# Patient Record
Sex: Male | Born: 1990
Health system: Southern US, Community
[De-identification: ages and names within clinical notes are randomized; demographics above are authoritative.]

## PROBLEM LIST (undated history)

## (undated) ENCOUNTER — Ambulatory Visit: Admission: EM | Payer: Managed Care, Other (non HMO) | Source: Home / Self Care

## (undated) HISTORY — PX: LAPAROSCOPIC CHOLECYSTECTOMY: SUR755

## (undated) HISTORY — PX: CHOLECYSTECTOMY: SHX55

---
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
## 2011-05-06 ENCOUNTER — Emergency Department (HOSPITAL_COMMUNITY): Payer: Self-pay

## 2011-05-06 ENCOUNTER — Emergency Department (HOSPITAL_COMMUNITY)
Admission: EM | Admit: 2011-05-06 | Discharge: 2011-05-06 | Disposition: A | Discharge status: Home / Self Care | Payer: Self-pay | Attending: Emergency Medicine | Admitting: Emergency Medicine

## 2011-05-06 ENCOUNTER — Encounter (HOSPITAL_COMMUNITY): Payer: Self-pay | Admitting: *Deleted

## 2011-05-06 DIAGNOSIS — F17200 Nicotine dependence, unspecified, uncomplicated: Secondary | ICD-10-CM | POA: Insufficient documentation

## 2011-05-06 DIAGNOSIS — R079 Chest pain, unspecified: Secondary | ICD-10-CM | POA: Insufficient documentation

## 2011-05-06 NOTE — ED Notes (Signed)
Approximately 2 weeks ago had gradual onset of intermittent midsternal chest pressure. Nausea without emesis. Also notes headache

## 2011-05-06 NOTE — ED Notes (Signed)
Discussed doing ECG with patient. Patient Refused. Does not have any insurance. Here because required by employer to have work note.

## 2011-05-06 NOTE — ED Provider Notes (Signed)
History     CSN: [id number]  Arrival date & time 05/06/11  4098   First MD Initiated Contact with Patient 05/06/11 [phone]      Chief Complaint  Patient presents with  . Chest Pain  . Nausea    (Consider location/radiation/quality/duration/timing/severity/associated sxs/prior treatment) HPI Pt presents with multiple complaints including headache, nausea, chest pain and shortness of breath.  He states that he called in sick to work 2 days in a row and presents to the ED to obtain a note from work.  He describes chest pain as intermittent over the past 2 weeks- located in his midsternum- pain has been coming and going- he states he was kicked in the chest during a mixed martial arts match and is unsure if this is related to his pain.  He also describes not being able to take a deep breath intermittently- but not related to pain.  No chest pain or sob on exertion.  No fever/chills.  Has had mild cough which he thinks is related to smoking cigarettes.  Pt is concerned that he does not want any tests to be done, but just wants a work  Note.   History reviewed. No pertinent past medical history.  Past Surgical History  Procedure Date  . Cholecystectomy     No family history on file.  History  Substance Use Topics  . Smoking status: Current Everyday Smoker -- 3.0 packs/day  . Smokeless tobacco: Not on file  . Alcohol Use: No      Review of Systems ROS reviewed and otherwise negative except for mentioned in HPI  Allergies  Review of patient's allergies indicates no known allergies.  Home Medications  No current outpatient prescriptions on file.  BP 127/68  Pulse 63  Temp(Src) 98.3 F (36.8 C) (Oral)  Resp 16  Ht 6\' 1"  (1.854 m)  Wt 200 lb (90.719 kg)  BMI 26.39 kg/m2  SpO2 100% Vitals reviewed Physical Exam Physical Examination: General appearance - alert, well appearing, and in no distress Mental status - alert, oriented to person, place, and time Eyes - pupils equal  and reactive, no scleral icterus Chest - clear to auscultation, no wheezes, rales or rhonchi, symmetric air entry, no increased respiratory effort or dyspnea Heart - normal rate, regular rhythm, normal S1, S2, no murmurs, rubs, clicks or gallops Abdomen - soft, nontender, nondistended, no masses or organomegaly, nabs Extremities - peripheral pulses normal, no pedal edema, no clubbing or cyanosis Skin - normal coloration and turgor, no rashes, brisk cap refill  ED Course  Procedures (including critical care time)  Labs Reviewed - No data to display No results found.   1. Chest pain       MDM  Pt presenting with multiple symtpoms, primarily chest pain intermittently over the past 2 weeks. EKG and CXR ordered.  This was discussed with the patient, he was concerned about the cost of these tests- I discussed with him the reason for ordering these screening tests.  He made the decision to leave without treatment complete.          Ethelda Chick, MD 05/06/11 [phone]

## 2014-10-11 ENCOUNTER — Emergency Department (HOSPITAL_COMMUNITY)
Admission: EM | Admit: 2014-10-11 | Discharge: 2014-10-11 | Disposition: A | Discharge status: Home / Self Care | Payer: Medicaid Other | Attending: Emergency Medicine | Admitting: Emergency Medicine

## 2014-10-11 ENCOUNTER — Encounter (HOSPITAL_COMMUNITY): Payer: Self-pay | Admitting: Nurse Practitioner

## 2014-10-11 DIAGNOSIS — Z720 Tobacco use: Secondary | ICD-10-CM | POA: Insufficient documentation

## 2014-10-11 DIAGNOSIS — M545 Low back pain, unspecified: Secondary | ICD-10-CM

## 2014-10-11 MED ORDER — TRAMADOL HCL 50 MG PO TABS
50.0000 mg | ORAL_TABLET | Freq: Once | ORAL | Status: AC
  Administered 2014-10-11: 50 mg via ORAL
  Filled 2014-10-11: qty 1

## 2014-10-11 MED ORDER — CYCLOBENZAPRINE HCL 10 MG PO TABS: 5.0000 mg | ORAL_TABLET | Freq: Two times a day (BID) | ORAL | Status: DC | PRN

## 2014-10-11 MED ORDER — PREDNISONE 10 MG PO TABS
20.0000 mg | ORAL_TABLET | Freq: Every day | ORAL | Status: DC
Start: 2014-10-11 — End: 2017-11-19

## 2014-10-11 MED ORDER — MELOXICAM 7.5 MG PO TABS
7.5000 mg | ORAL_TABLET | Freq: Every day | ORAL | Status: DC
Start: 2014-10-11 — End: 2017-11-19

## 2014-10-11 MED ORDER — CYCLOBENZAPRINE HCL 10 MG PO TABS
5.0000 mg | ORAL_TABLET | Freq: Once | ORAL | Status: AC
  Administered 2014-10-11: 5 mg via ORAL
  Filled 2014-10-11: qty 1

## 2014-10-11 NOTE — ED Provider Notes (Signed)
CSN: [id number]     Arrival date & time 10/11/14  1153 History   First MD Initiated Contact with Patient 10/11/14 1232     Chief Complaint  Patient presents with  . Back Pain     (Consider location/radiation/quality/duration/timing/severity/associated sxs/prior Treatment) HPI   Brent Waller 24 y.o.male  PCP: No primary care provider on file.  Blood pressure 137/85, pulse 84, temperature 97.8 F (36.6 C), temperature source Oral, resp. rate 16, height  (1.854 m), weight 291 lb 3.2 oz (132.087 kg), SpO2 97 %.  SIGNIFICANT PMH: cholecystectomy CHIEF COMPLAINT: low back pain  When: Started 1 week ago How: no known injury Duration: has been persisting for 1 week Location: low back Radiation: radiates up into the upper back Quality: spasm  Alleviating factors: least amount of pain when he wakes up in the morning Worsening factors: standing up straight Treatments tried: Ibuprofen for 3 days Associated Symptoms: None Negative ROS: Confusion, diaphoresis, fever, headache, weakness (general or focal), change of vision,  neck pain, dysphagia, aphagia, chest pain, shortness of breath, , abdominal pains, nausea, vomiting, diarrhea, lower extremity swelling, rash.   History reviewed. No pertinent past medical history. Past Surgical History  Procedure Laterality Date  . Cholecystectomy     History reviewed. No pertinent family history. Social History  Substance Use Topics  . Smoking status: Current Every Day Smoker -- 3.00 packs/day  . Smokeless tobacco: None  . Alcohol Use: No    Review of Systems 10 Systems reviewed and are negative for acute change except as noted in the HPI.    Allergies  Review of patient's allergies indicates no known allergies.  Home Medications   Prior to Admission medications   Medication Sig Start Date End Date Taking? Authorizing Provider  cyclobenzaprine (FLEXERIL) 10 MG tablet Take 0.5-1 tablets (5-10 mg total) by mouth 2 (two) times  daily as needed for muscle spasms. 10/11/14   Reita Shindler Neva Seat, PA-C  meloxicam (MOBIC) 7.5 MG tablet Take 1 tablet (7.5 mg total) by mouth daily. 10/11/14   Rasean Joos Neva Seat, PA-C  predniSONE (DELTASONE) 10 MG tablet Take 2 tablets (20 mg total) by mouth daily. 10/11/14   Samoria Fedorko Neva Seat, PA-C   BP 137/85 mmHg  Pulse 84  Temp(Src) 97.8 F (36.6 C) (Oral)  Resp 16  Ht  (1.854 m)  Wt 291 lb 3.2 oz (132.087 kg)  BMI 38.43 kg/m2  SpO2 97% Physical Exam  Constitutional: He appears well-developed and well-nourished. No distress.  HENT:  Head: Normocephalic and atraumatic.  Eyes: Pupils are equal, round, and reactive to light.  Neck: Normal range of motion. Neck supple.  Cardiovascular: Normal rate and regular rhythm.   Pulmonary/Chest: Effort normal.  Abdominal: Soft.  Musculoskeletal:  Symmetrical and physiologic strength to bilateral lower extremities.  Neurosensory function adequate to both legs Skin color is normal. Skin is warm and moist.  No step off deformity appreciated and no midline bony tenderness.  Can ambulate with some discomfort.  No crepitus, laceration, effusion, induration, lesions Pedal pulses are symmetrical and palpable bilaterally  No tenderness to palpation of back or hips. No clonus on dorsiflextion   Neurological: He is alert.  Skin: Skin is warm and dry.  Nursing note and vitals reviewed.   ED Course  Procedures (including critical care time) Labs Review Labs Reviewed - No data to display  Imaging Review No results found. I have personally reviewed and evaluated these images and lab results as part of my medical decision-making.   EKG  Interpretation None      MDM   Final diagnoses:  Bilateral low back pain without sciatica   Referral to Ortho. Advised not to drive on muscle relaxer medication.    Medication List    TAKE these medications        cyclobenzaprine 10 MG tablet  Commonly known as:  FLEXERIL  Take 0.5-1 tablets (5-10 mg  total) by mouth 2 (two) times daily as needed for muscle spasms.     meloxicam 7.5 MG tablet  Commonly known as:  MOBIC  Take 1 tablet (7.5 mg total) by mouth daily.     predniSONE 10 MG tablet  Commonly known as:  DELTASONE  Take 2 tablets (20 mg total) by mouth daily.        No neurological deficits and normal neuro exam. No loss of bowel or bladder control. No concern for cauda equina at this time base on HPI and physical exam findings. No fever, night sweats, weight loss, h/o cancer, IVDU. The patient can walk with some discomfort.   Patient Plan 1. Medications: NSAIDs and/or muscle relaxer. Cont usual home medications unless otherwise directed. 2. Treatment: rest, drink plenty of fluids, gentle stretching as discussed, alternate ice and heat  3. Follow Up: Please followup with your primary doctor for discussion of your diagnoses and further evaluation after today's visit; if you do not have a primary care doctor use the resource guide provided to find one  Advised to follow-up with the orthopedist if symptoms do not start to resolve in the next 2-3 days. If develop loss of bowel or urinary control return to the ED as soon as possible for further evaluation. To take the medications as prescribed as they can cause harm if not taken appropriately.   Vital signs are stable at discharge. Filed Vitals:   10/11/14 1207  BP: 137/85  Pulse: 84  Temp: 97.8 F (36.6 C)  Resp: 16    Patient/guardian has voiced understanding and agreed to follow-up with the PCP or specialist.         Marlon Pel, PA-C 10/11/14 1321  Gerhard Munch, MD 10/11/14 [phone]

## 2014-10-11 NOTE — ED Notes (Signed)
Declined W/C at D/C and was escorted to lobby by RN. 

## 2014-10-11 NOTE — Discharge Instructions (Signed)
Back Pain, Adult Low back pain is very common. About 1 in 5 people have back pain.The cause of low back pain is rarely dangerous. The pain often gets better over time.About half of people with a sudden onset of back pain feel better in just 2 weeks. About 8 in 10 people feel better by 6 weeks.  CAUSES Some common causes of back pain include:  Strain of the muscles or ligaments supporting the spine.  Wear and tear (degeneration) of the spinal discs.  Arthritis.  Direct injury to the back. DIAGNOSIS Most of the time, the direct cause of low back pain is not known.However, back pain can be treated effectively even when the exact cause of the pain is unknown.Answering your caregiver's questions about your overall health and symptoms is one of the most accurate ways to make sure the cause of your pain is not dangerous. If your caregiver needs more information, he or she may order lab work or imaging tests (X-rays or MRIs).However, even if imaging tests show changes in your back, this usually does not require surgery. HOME CARE INSTRUCTIONS For many people, back pain returns.Since low back pain is rarely dangerous, it is often a condition that people can learn to [facility] their own.   Remain active. It is stressful on the back to sit or stand in one place. Do not sit, drive, or stand in one place for more than 30 minutes at a time. Take short walks on level surfaces as soon as pain allows.Try to increase the length of time you walk each day.  Do not stay in bed.Resting more than 1 or 2 days can delay your recovery.  Do not avoid exercise or work.Your body is made to move.It is not dangerous to be active, even though your back may hurt.Your back will likely heal faster if you return to being active before your pain is gone.  Pay attention to your body when you bend and lift. Many people have less discomfortwhen lifting if they bend their knees, keep the load close to their bodies,and  avoid twisting. Often, the most comfortable positions are those that put less stress on your recovering back.  Find a comfortable position to sleep. Use a firm mattress and lie on your side with your knees slightly bent. If you lie on your back, put a pillow under your knees.  Only take over-the-counter or prescription medicines as directed by your caregiver. Over-the-counter medicines to reduce pain and inflammation are often the most helpful.Your caregiver may prescribe muscle relaxant drugs.These medicines help dull your pain so you can more quickly return to your normal activities and healthy exercise.  Put ice on the injured area.  Put ice in a plastic bag.  Place a towel between your skin and the bag.  Leave the ice on for 15-20 minutes, 03-04 times a day for the first 2 to 3 days. After that, ice and heat may be alternated to reduce pain and spasms.  Ask your caregiver about trying back exercises and gentle massage. This may be of some benefit.  Avoid feeling anxious or stressed.Stress increases muscle tension and can worsen back pain.It is important to recognize when you are anxious or stressed and learn ways to manage it.Exercise is a great option. SEEK MEDICAL CARE IF:  You have pain that is not relieved with rest or medicine.  You have pain that does not improve in 1 week.  You have new symptoms.  You are generally not feeling well. SEEK  IMMEDIATE MEDICAL CARE IF:  °· You have pain that radiates from your back into your legs. °· You develop new bowel or bladder control problems. °· You have unusual weakness or numbness in your arms or legs. °· You develop nausea or vomiting. °· You develop abdominal pain. °· You feel faint. °Document Released: 01/30/2005 Document Revised: 08/01/2011 Document Reviewed: 06/03/2013 °ExitCare® Patient Information ©2015 [employer]. This information is not intended to replace advice given to you by your health care provider. Make sure you  discuss any questions you have with your health care provider. ° °Back Injury Prevention °The following tips can help you to prevent a back injury. °PHYSICAL FITNESS °· Exercise often. Try to develop strong stomach (abdominal) muscles. °· Do aerobic exercises often. This includes walking, jogging, biking, swimming. °· Do exercises that help with balance and strength often. This includes tai chi and yoga. °· Stretch before and after you exercise. °· Keep a healthy weight. °DIET  °· Ask your doctor how much calcium and vitamin D you need every day. °· Include calcium in your diet. Foods high in calcium include dairy products; green, leafy vegetables; and products with calcium added (fortified). °· Include vitamin D in your diet. Foods high in vitamin D include milk and products with vitamin D added. °· Think about taking a multivitamin or other nutritional products called " supplements." °· Stop smoking if you smoke. °POSTURE  °· Sit and stand up straight. Avoid leaning forward or hunching over. °· Choose chairs that support your lower back. °· If you work at a desk: °¨ Sit close to your work so you do not lean over. °¨ Keep your chin tucked in. °¨ Keep your neck drawn back. °· Keep your elbows bent at a right angle. Your arms should look like the letter "L." °· Sit high and close to the steering wheel when you drive. Add low back support to your car seat if needed. °· Avoid sitting or standing in one position for too long. Get up and move around every hour. Take breaks if you are driving for a long time. °· Sleep on your side with your knees slightly bent. You can also sleep on your back with a pillow under your knees. Do not sleep on your stomach. °LIFTING, TWISTING, AND REACHING °· Avoid heavy lifting, especially lifting over and over again. If you must do heavy lifting: °¨ Stretch before lifting. °¨ Work slowly. °¨ Rest between lifts. °¨ Use carts and dollies to move objects when possible. °¨ Make several small  trips instead of carrying 1 heavy load. °¨ Ask for help when you need it. °¨ Ask for help when moving big, awkward objects. °· Follow these steps when lifting: °¨ Stand with your feet shoulder-width apart. °¨ Get as close to the object as you can. Do not pick up heavy objects that are far from your body. °¨ Use handles or lifting straps when possible. °¨ Bend at your knees. Squat down, but keep your heels off the floor. °¨ Keep your shoulders back, your chin tucked in, and your back straight. °¨ Lift the object slowly. Tighten the muscles in your legs, stomach, and butt. Keep the object as close to the center of your body as possible. °¨ Reverse these directions when you put a load down. °· Do not: °¨ Lift the object above your waist. °¨ Twist at the waist while lifting or carrying a load. Move your feet if you need to turn, not your waist. °¨   Bend over without bending at your knees. °· Avoid reaching over your head, across a table, or for an object on a high surface. °OTHER TIPS °· Avoid wet floors and keep sidewalks clear of ice. °· Do not sleep on a mattress that is too soft or too hard. °· Keep items that you use often within easy reach. °· Put heavier objects on shelves at waist level. Put lighter objects on lower or higher shelves. °· Find ways to lessen your stress. You can try exercise, massage, or relaxation. °· Get help for depression or anxiety if needed. °GET HELP IF: °· You injure your back. °· You have questions about diet, exercise, or other ways to prevent back injuries. °MAKE SURE YOU: °· Understand these instructions. °· Will watch your condition. °· Will get help right away if you are not doing well or get worse. °Document Released: 07/19/2007 Document Revised: 04/24/2011 Document Reviewed: 03/13/2011 °ExitCare® Patient Information ©2015 [employer]. This information is not intended to replace advice given to you by your health care provider. Make sure you discuss any questions you have with  your health care provider. ° °

## 2014-10-11 NOTE — ED Notes (Signed)
He c/o lower back pain radiating to upper back x 1 weeks. Pain increased with standing up straight, decreased when he wakes up in the am. Took ibuprofen for 3 days with no relief. denies bowel/bladder changes. Ambulatory, mae

## 2017-07-02 DIAGNOSIS — Z113 Encounter for screening for infections with a predominantly sexual mode of transmission: Secondary | ICD-10-CM | POA: Diagnosis not present

## 2017-07-06 ENCOUNTER — Ambulatory Visit (INDEPENDENT_AMBULATORY_CARE_PROVIDER_SITE_OTHER): Payer: BLUE CROSS/BLUE SHIELD | Admitting: Physician Assistant

## 2017-07-06 ENCOUNTER — Encounter: Payer: Self-pay | Admitting: Physician Assistant

## 2017-07-06 VITALS — BP 120/82 | HR 76 | Temp 99.0°F | Resp 16 | Ht 74.0 in | Wt 303.0 lb

## 2017-07-06 DIAGNOSIS — Z130 Encounter for screening for diseases of the blood and blood-forming organs and certain disorders involving the immune mechanism: Secondary | ICD-10-CM

## 2017-07-06 DIAGNOSIS — Z13220 Encounter for screening for lipoid disorders: Secondary | ICD-10-CM | POA: Diagnosis not present

## 2017-07-06 DIAGNOSIS — Z131 Encounter for screening for diabetes mellitus: Secondary | ICD-10-CM

## 2017-07-06 DIAGNOSIS — Z1329 Encounter for screening for other suspected endocrine disorder: Secondary | ICD-10-CM

## 2017-07-06 DIAGNOSIS — K921 Melena: Secondary | ICD-10-CM | POA: Diagnosis not present

## 2017-07-06 DIAGNOSIS — R1902 Left upper quadrant abdominal swelling, mass and lump: Secondary | ICD-10-CM

## 2017-07-06 DIAGNOSIS — Z113 Encounter for screening for infections with a predominantly sexual mode of transmission: Secondary | ICD-10-CM | POA: Diagnosis not present

## 2017-07-06 NOTE — Patient Instructions (Signed)
Can try flonase 2 sprays each nostril daily. Can take daily allergy medication.

## 2017-07-06 NOTE — Progress Notes (Signed)
Patient: Brent Waller Male    DOB: 12/16/1990   27 y.o.   MRN: [id number] Visit Date: 07/06/2017  Today's Provider: Trey Sailors, PA-C   Chief Complaint  Patient presents with  . Establish Care   Subjective:    HPI  Brent Waller is a 27 y/o man presenting today to establish care. He is living in [place], living with two roommates. 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 He smokes cigarettes occasionally when he is stressed. He smokes marijuana, does not use IV drugs. He is currently studying at [employer] and plans on transferring to [employer] to get his Bachelor's degree at [employer] in Human resources officer.   He reports he was prediabetic at age 27 - followed up after 6 months, and then decided not to care. He doesn't know who his pediatrciain was.   He reports being diagnosed with chlamydia at urgent care last week. Says he was treated with an IM shot and then four pills of "something that started with a D." Reports his partner has been treated. He wants to be tested for additional STI, including Herpes virus. He particularly wants to be tested for HSV because he says he has lesions in his genital and armpit regions that have been present for four years. He reports the areas swell up and can sometimes emit pus.   Also wants to know why his ears have pressure when he goes 1 foot under water.   He also has an abdominal bulge that he wonders about.    He reports blood in his stool that is bright red and intermittent. He reports alternating constipation and diarrhea that he attributes to his diet. He reports anal itching. He says he wants to be screened for colon cancer.   He reports he is currently at his highest weight. He says he has 1-2 days of good eating habits, followed by a week of binging on food.      No Known Allergies   Current Outpatient Medications:  .  cyclobenzaprine (FLEXERIL) 10 MG tablet, Take  0.5-1 tablets (5-10 mg total) by mouth 2 (two) times daily as needed for muscle spasms. (Patient not taking: Reported on 07/06/2017), Disp: 20 tablet, Rfl: 0 .  meloxicam (MOBIC) 7.5 MG tablet, Take 1 tablet (7.5 mg total) by mouth daily. (Patient not taking: Reported on 07/06/2017), Disp: 14 tablet, Rfl: 0 .  predniSONE (DELTASONE) 10 MG tablet, Take 2 tablets (20 mg total) by mouth daily. (Patient not taking: Reported on 07/06/2017), Disp: 15 tablet, Rfl: 0  Review of Systems  Constitutional: Positive for activity change, appetite change, diaphoresis and fatigue. Negative for chills, fever and unexpected weight change.  HENT: Positive for dental problem and ear pain. Negative for congestion, drooling, ear discharge, facial swelling, hearing loss, mouth sores, nosebleeds, postnasal drip, rhinorrhea, sinus pressure, sinus pain, sneezing, sore throat, tinnitus, trouble swallowing and voice change.   Eyes: Positive for visual disturbance. Negative for photophobia, pain, discharge, redness and itching.  Respiratory: Positive for chest tightness. Negative for apnea, cough, choking, shortness of breath, wheezing and stridor.   Cardiovascular: Positive for chest pain. Negative for palpitations and leg swelling.  Gastrointestinal: Positive for abdominal distention and abdominal pain. Negative for anal bleeding, blood in stool, constipation, diarrhea, nausea, rectal pain and vomiting.  Endocrine: Negative.   Genitourinary: Positive for genital sores. Negative for decreased urine volume, difficulty urinating, discharge, dysuria, enuresis, flank pain,  frequency, hematuria, penile pain, penile swelling, scrotal swelling, testicular pain and urgency.  Musculoskeletal: Negative.   Skin: Negative.   Allergic/Immunologic: Negative.   Neurological: Negative.   Hematological: Negative.   Psychiatric/Behavioral: Negative.     Social History   Tobacco Use  . Smoking status: Former Smoker    Packs/day: 0.50     Types: Cigarettes    Last attempt to quit: 02/14/2015    Years since quitting: 2.3  . Smokeless tobacco: Never Used  Substance Use Topics  . Alcohol use: No   Objective:   BP 120/82 (BP Location: Right Arm, Patient Position: Sitting, Cuff Size: Large)   Pulse 76   Temp 99 F (37.2 C) (Oral)   Resp 16   Ht  (1.88 m)   Wt (!) 303 lb (137.4 kg)   BMI 38.90 kg/m  Vitals:   07/06/17 1506  BP: 120/82  Pulse: 76  Resp: 16  Temp: 99 F (37.2 C)  TempSrc: Oral  Weight: (!) 303 lb (137.4 kg)  Height:  (1.88 m)     Physical Exam  Constitutional: He is oriented to person, place, and time. He appears well-developed and well-nourished.  HENT:  Head: Normocephalic.  Right Ear: External ear normal.  Left Ear: External ear normal.  Mouth/Throat: Oropharynx is clear and moist.  Cardiovascular: Normal rate and regular rhythm.  Pulmonary/Chest: Effort normal and breath sounds normal.  Abdominal: Soft. Bowel sounds are normal.    Neurological: He is alert and oriented to person, place, and time.  Skin: Skin is warm and dry.        Assessment & Plan:     1. Screening examination for STD (sexually transmitted disease)  I have counseled him, particularly about HSV serology. It does not tell where the virus is on the body. I am getting this purely at his request, and have counseled that to confirm the location he would need a swab. Any further questions regarding the results of this test would require an office visit. He expresses understanding and is agreeable with the plan.   - HIV antibody (with reflex) - RPR - Hepatitis c antibody (reflex) - HSV(herpes simplex vrs) 1+2 ab-IgG  2. Left upper quadrant abdominal mass  Potential hernia. Counseled on precautions.  3. Blood in stool  Counseled on difference between screening and diagnostic. Provided oc light to take self sample and should return to office.   - POCT Occult Blood Stool  4. Screening cholesterol  level  - Lipid Profile  5. Diabetes mellitus screening  - Comprehensive Metabolic Panel (CMET)  6. Thyroid disorder screening  - TSH  7. Screening for deficiency anemia  - CBC with Differential   Return in about 1 year (around 07/07/2018) for CPE.  The entirety of the information documented in the History of Present Illness, Review of Systems and Physical Exam were personally obtained by me. Portions of this information were initially documented by Kavin Leech, CMA and reviewed by me for thoroughness and accuracy.        Trey Sailors, PA-C  [facility]

## 2017-07-16 ENCOUNTER — Other Ambulatory Visit: Payer: Self-pay | Admitting: Physician Assistant

## 2017-07-16 DIAGNOSIS — Z1329 Encounter for screening for other suspected endocrine disorder: Secondary | ICD-10-CM | POA: Diagnosis not present

## 2017-07-16 DIAGNOSIS — Z113 Encounter for screening for infections with a predominantly sexual mode of transmission: Secondary | ICD-10-CM

## 2017-07-16 DIAGNOSIS — Z13220 Encounter for screening for lipoid disorders: Secondary | ICD-10-CM | POA: Diagnosis not present

## 2017-07-16 DIAGNOSIS — Z131 Encounter for screening for diabetes mellitus: Secondary | ICD-10-CM | POA: Diagnosis not present

## 2017-07-17 LAB — CBC WITH DIFFERENTIAL/PLATELET
Basophils Absolute: 0.1 10*3/uL (ref 0.0–0.2)
Basos: 1 %
EOS (ABSOLUTE): 0.1 10*3/uL (ref 0.0–0.4)
Eos: 1 %
Hematocrit: 37.5 % (ref 37.5–51.0)
Hemoglobin: 12.5 g/dL — ABNORMAL LOW (ref 13.0–17.7)
Immature Grans (Abs): 0 10*3/uL (ref 0.0–0.1)
Immature Granulocytes: 0 %
Lymphocytes Absolute: 2.2 10*3/uL (ref 0.7–3.1)
Lymphs: 19 %
MCH: 24.6 pg — ABNORMAL LOW (ref 26.6–33.0)
MCHC: 33.3 g/dL (ref 31.5–35.7)
MCV: 74 fL — ABNORMAL LOW (ref 79–97)
Monocytes Absolute: 0.9 10*3/uL (ref 0.1–0.9)
Monocytes: 8 %
Neutrophils Absolute: 8.2 10*3/uL — ABNORMAL HIGH (ref 1.4–7.0)
Neutrophils: 71 %
Platelets: 421 10*3/uL (ref 150–450)
RBC: 5.09 x10E6/uL (ref 4.14–5.80)
RDW: 16.4 % — ABNORMAL HIGH (ref 12.3–15.4)
WBC: 11.6 10*3/uL — ABNORMAL HIGH (ref 3.4–10.8)

## 2017-07-17 LAB — COMPREHENSIVE METABOLIC PANEL
ALT: 26 IU/L (ref 0–44)
AST: 31 IU/L (ref 0–40)
Albumin/Globulin Ratio: 1.4 (ref 1.2–2.2)
Albumin: 4.4 g/dL (ref 3.5–5.5)
Alkaline Phosphatase: 73 IU/L (ref 39–117)
BUN/Creatinine Ratio: 11 (ref 9–20)
BUN: 12 mg/dL (ref 6–20)
Bilirubin Total: 0.7 mg/dL (ref 0.0–1.2)
CO2: 20 mmol/L (ref 20–29)
Calcium: 9.2 mg/dL (ref 8.7–10.2)
Chloride: 103 mmol/L (ref 96–106)
Creatinine, Ser: 1.09 mg/dL (ref 0.76–1.27)
GFR calc Af Amer: 108 mL/min/{1.73_m2} (ref >59)
GFR calc non Af Amer: 93 mL/min/{1.73_m2} (ref >59)
Globulin, Total: 3.2 g/dL (ref 1.5–4.5)
Glucose: 86 mg/dL (ref 65–99)
Potassium: 4 mmol/L (ref 3.5–5.2)
Sodium: 138 mmol/L (ref 134–144)
Total Protein: 7.6 g/dL (ref 6.0–8.5)

## 2017-07-17 LAB — LIPID PANEL
Chol/HDL Ratio: 4.3 ratio (ref 0.0–5.0)
Cholesterol, Total: 155 mg/dL (ref 100–199)
HDL: 36 mg/dL — ABNORMAL LOW (ref >39)
LDL Calculated: 100 mg/dL — ABNORMAL HIGH (ref 0–99)
Triglycerides: 96 mg/dL (ref 0–149)
VLDL Cholesterol Cal: 19 mg/dL (ref 5–40)

## 2017-07-17 LAB — HIV ANTIBODY (ROUTINE TESTING W REFLEX): HIV Screen 4th Generation wRfx: NONREACTIVE

## 2017-07-17 LAB — HCV COMMENT:

## 2017-07-17 LAB — HSV(HERPES SIMPLEX VRS) I + II AB-IGG
HSV 1 Glycoprotein G Ab, IgG: 57.9 index — ABNORMAL HIGH (ref 0.00–0.90)
HSV 2 IgG, Type Spec: 1.64 index — ABNORMAL HIGH (ref 0.00–0.90)

## 2017-07-17 LAB — HSV-2 IGG SUPPLEMENTAL TEST: HSV-2 IgG Supplemental Test: POSITIVE — AB

## 2017-07-17 LAB — HEPATITIS C ANTIBODY (REFLEX): HCV Ab: <0.1 s/co ratio (ref 0.0–0.9)

## 2017-07-17 LAB — RPR: RPR Ser Ql: NONREACTIVE

## 2017-07-17 LAB — TSH: TSH: 2.46 u[IU]/mL (ref 0.450–4.500)

## 2017-07-18 ENCOUNTER — Telehealth: Payer: Self-pay

## 2017-07-18 LAB — CHLAMYDIA/GONOCOCCUS/TRICHOMONAS, NAA
Chlamydia by NAA: NEGATIVE
Gonococcus by NAA: NEGATIVE
Trich vag by NAA: NEGATIVE

## 2017-07-18 NOTE — Telephone Encounter (Signed)
Pt advised. Appointment made for 07/24/2017 at 4pm  Thanks,   -Vernona RiegerLaura

## 2017-07-18 NOTE — Telephone Encounter (Signed)
-----   Message from Trey SailorsAdriana M Pollak, [place]PA-C sent at 07/18/2017  8:19 AM EDT ----- Gonorrhea, chlamydia, and trichomonas all negative.

## 2017-07-18 NOTE — Telephone Encounter (Signed)
-----   Message from Trey SailorsAdriana M Pollak, [place] sent at 07/17/2017 10:51 AM EDT ----- HIV non reactive. Syphilis negative. Hep C negative. 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 As we discussed in office, this does not indicate where the infection is on the body. If he would like to discuss this further, please have him make office visit. Cholesterol well controlled. CMET normal, shows no diabetes. CBC shows a small white count, may be a small infection. Slightly low hemoglobin. Can repeat in one month if patient interested. Urine gonorrhea, chlamydia and trich is still pending.

## 2017-07-24 ENCOUNTER — Ambulatory Visit: Payer: BLUE CROSS/BLUE SHIELD | Admitting: Physician Assistant

## 2017-07-25 ENCOUNTER — Ambulatory Visit: Payer: BLUE CROSS/BLUE SHIELD | Admitting: Physician Assistant

## 2017-09-04 DIAGNOSIS — K219 Gastro-esophageal reflux disease without esophagitis: Secondary | ICD-10-CM | POA: Diagnosis not present

## 2017-09-04 DIAGNOSIS — R0789 Other chest pain: Secondary | ICD-10-CM | POA: Diagnosis not present

## 2017-09-05 ENCOUNTER — Encounter: Payer: Self-pay | Admitting: Physician Assistant

## 2017-09-05 ENCOUNTER — Ambulatory Visit
Admission: RE | Admit: 2017-09-05 | Discharge: 2017-09-05 | Disposition: A | Discharge status: Home / Self Care | Payer: BLUE CROSS/BLUE SHIELD | Source: Ambulatory Visit | Attending: Physician Assistant | Admitting: Physician Assistant

## 2017-09-05 ENCOUNTER — Ambulatory Visit (INDEPENDENT_AMBULATORY_CARE_PROVIDER_SITE_OTHER): Payer: BLUE CROSS/BLUE SHIELD | Admitting: Physician Assistant

## 2017-09-05 VITALS — BP 112/62 | HR 100 | Temp 99.9°F | Resp 16 | Wt 284.0 lb

## 2017-09-05 DIAGNOSIS — R059 Cough, unspecified: Secondary | ICD-10-CM

## 2017-09-05 DIAGNOSIS — R062 Wheezing: Secondary | ICD-10-CM | POA: Diagnosis not present

## 2017-09-05 DIAGNOSIS — R509 Fever, unspecified: Secondary | ICD-10-CM | POA: Insufficient documentation

## 2017-09-05 DIAGNOSIS — R05 Cough: Secondary | ICD-10-CM | POA: Insufficient documentation

## 2017-09-05 DIAGNOSIS — R079 Chest pain, unspecified: Secondary | ICD-10-CM | POA: Diagnosis not present

## 2017-09-05 MED ORDER — ALBUTEROL SULFATE HFA 108 (90 BASE) MCG/ACT IN AERS: 2.0000 | INHALATION_SPRAY | Freq: Four times a day (QID) | RESPIRATORY_TRACT | 2 refills | Status: DC | PRN

## 2017-09-05 NOTE — Patient Instructions (Signed)

## 2017-09-05 NOTE — Progress Notes (Signed)
Patient: Brent Waller Elsey Male    DOB: 02-Aug-1990   27 y.o.   MRN: [id number] Visit Date: 09/06/2017  Today's Provider: Trey SailorsAdriana M Pollak, PA-C   Chief Complaint  Patient presents with  . Cough   Subjective:    Brent Waller Kulish is a 27 y/o presenting today for flu like symptoms ongoing since Sunday. He reports a fever, sore throat, cough, congestion and muscle aches. He felt very tired and was unable to get out of bed. He has been taking some ibuprofen. He does feel moderately improved but has started having chest pain which brought him to urgent care yesterday. He has chest pain to the right of his sternum and his left ribcage. Can be random, hurts worse when he coughs. Does not get worse with activity and better with rest. He has had some SOB with activity recently, like going up the stairs to clinic. He has also had a decreased appetite.  URI   This is a new problem. The current episode started in the past 7 days. The problem has been gradually improving. Associated symptoms include abdominal pain, congestion, coughing, nausea, a rash and wheezing. Pertinent negatives include no diarrhea, ear pain, headaches, neck pain, rhinorrhea, sinus pain, sore throat or vomiting. He has tried NSAIDs for the symptoms.       No Known Allergies   Current Outpatient Medications:  .  albuterol (PROVENTIL HFA;VENTOLIN HFA) 108 (90 Base) MCG/ACT inhaler, Inhale 2 puffs into the lungs every 6 (six) hours as needed for wheezing or shortness of breath., Disp: 1 Inhaler, Rfl: 2 .  cyclobenzaprine (FLEXERIL) 10 MG tablet, Take 0.5-1 tablets (5-10 mg total) by mouth 2 (two) times daily as needed for muscle spasms. (Patient not taking: Reported on 09/05/2017), Disp: 20 tablet, Rfl: 0 .  meloxicam (MOBIC) 7.5 MG tablet, Take 1 tablet (7.5 mg total) by mouth daily. (Patient not taking: Reported on 09/05/2017), Disp: 14 tablet, Rfl: 0 .  predniSONE (DELTASONE) 10 MG tablet, Take 2 tablets (20 mg total) by mouth  daily. (Patient not taking: Reported on 07/06/2017), Disp: 15 tablet, Rfl: 0  Review of Systems  Constitutional: Positive for appetite change, chills, diaphoresis, fatigue and fever. Negative for activity change and unexpected weight change.  HENT: Positive for congestion. Negative for ear discharge, ear pain, postnasal drip, rhinorrhea, sinus pressure, sinus pain, sore throat and tinnitus.   Eyes: Negative.   Respiratory: Positive for cough, chest tightness, shortness of breath and wheezing. Negative for apnea, choking and stridor.   Gastrointestinal: Positive for abdominal pain, blood in stool (Occasionally) and nausea. Negative for abdominal distention, anal bleeding, constipation, diarrhea, rectal pain and vomiting.  Musculoskeletal: Positive for myalgias. Negative for arthralgias, back pain, gait problem, joint swelling, neck pain and neck stiffness.  Skin: Positive for rash. Negative for color change, pallor and wound.  Neurological: Positive for dizziness. Negative for light-headedness and headaches.    Social History   Tobacco Use  . Smoking status: Former Smoker    Packs/day: 0.50    Types: Cigarettes    Last attempt to quit: 02/14/2015    Years since quitting: 2.5  . Smokeless tobacco: Never Used  Substance Use Topics  . Alcohol use: No   Objective:   BP 112/62 (BP Location: Right Arm, Patient Position: Sitting, Cuff Size: Large)   Pulse 100   Temp 99.9 F (37.7 C) (Oral)   Resp 16   Wt 284 lb (128.8 kg)   SpO2 95%  BMI 36.46 kg/m  Vitals:   09/05/17 1630  BP: 112/62  Pulse: 100  Resp: 16  Temp: 99.9 F (37.7 C)  TempSrc: Oral  SpO2: 95%  Weight: 284 lb (128.8 kg)     Physical Exam  Constitutional: He is oriented to person, place, and time. He appears well-developed and well-nourished. He appears ill.  Cardiovascular: Normal rate and regular rhythm.  Pulmonary/Chest: Effort normal and breath sounds normal. No respiratory distress. He has no wheezes. He has  no rales.  Neurological: He is alert and oriented to person, place, and time.  Skin: Skin is warm and dry.  Psychiatric: He has a normal mood and affect. His behavior is normal.        Assessment & Plan:     1. Cough  Patient does appear ill in clinic, slight temperature with borderline tachycardia. He does report feeing a lot better but was concerned about his chest pain. Think this is not cardiac and most likely inflammatory from his URI and strain from coughing. Will get CXR for reassurance, no adverse sounds on exam. Does not have any rash to indicate tick borne illness and he has clear respiratory symptoms. Advised to take Tylenol to lower temperature, drink plenty of fluids, and use Delsym over the counter for cough.   - DG Chest 2 View; Future  2. Fever, unspecified fever cause  - DG Chest 2 View; Future  3. Wheezing  Can use as below for SOB.  - albuterol (PROVENTIL HFA;VENTOLIN HFA) 108 (90 Base) MCG/ACT inhaler; Inhale 2 puffs into the lungs every 6 (six) hours as needed for wheezing or shortness of breath.  Dispense: 1 Inhaler; Refill: 2  Return if symptoms worsen or fail to improve.  The entirety of the information documented in the History of Present Illness, Review of Systems and Physical Exam were personally obtained by me. Portions of this information were initially documented by Kavin Leech, CMA and reviewed by me for thoroughness and accuracy.         Trey Sailors, PA-C  [facility]

## 2017-09-06 ENCOUNTER — Telehealth: Payer: Self-pay

## 2017-09-06 NOTE — Telephone Encounter (Signed)
-----   Message from Trey SailorsAdriana M Pollak, [place]C sent at 09/06/2017  8:16 AM EDT ----- CXR normal, no signs of pneumonia.

## 2017-09-06 NOTE — Telephone Encounter (Signed)
Pt advised.   Thanks,   -Claira Jeter  

## 2017-11-18 NOTE — Progress Notes (Signed)
Cardiology Office Note  Date:  11/19/2017   ID:  Brent Waller, DOB 11/30/1990, MRN [id number]  PCP:  Trey Sailors, PA-C   Chief Complaint  Patient presents with  . OTHER    Chest pain, bilateral arm pain, bad night sweats and sob. Meds reviewed verbally with pt.    HPI:  Mr. Brent Waller is a 27 year old male with history of Smoker of MAJ Morbid obesity Self referral for chest pain  In July 2019 he developed viral symptoms Symptoms of fever, sore throat, cough, congestion and muscle aches.  tired and was unable to get out of bed.  Developed some chest pain and was seen in urgent care chest pain to the right of his sternum and his left ribcage. hurts worse when he coughs. Does not get worse with activity and better with rest.  The symptoms have persisted several months later When working out, gets chest pain,  Pain is bilateral worse on the left on the right lower ribs He smokes marijuana frequently, typically will cough Sometimes with arm pain  Normal CXR, need with him  Lab work reviewed Total cholesterol 155 Does not smoke regular cigarettes Most recent glucose level less than 100  He is trying to eat better, intentionally trying to lose weight  EKG personally reviewed by myself on todays visit Shows normal sinus rhythm rate 79 bpm no significant ST or T wave changes   PMH: Morbid obesity Smokes marijuana  PSH:    Past Surgical History:  Procedure Laterality Date  . CHOLECYSTECTOMY      Current Outpatient Medications  Medication Sig Dispense Refill  . albuterol (PROVENTIL HFA;VENTOLIN HFA) 108 (90 Base) MCG/ACT inhaler Inhale 2 puffs into the lungs every 6 (six) hours as needed for wheezing or shortness of breath. 1 Inhaler 2   No current facility-administered medications for this visit.      Allergies:   Patient has no known allergies.   Social History:  The patient  reports that he quit smoking about 2 years ago. His smoking use included  cigarettes. He smoked 0.50 packs per day. He has never used smokeless tobacco. He reports that he drinks alcohol. He reports that he has current or past drug history. Drug: Marijuana.   Family History:   family history includes Cancer in his maternal grandfather; Heart Problems in his father; Lung cancer in his maternal grandmother; Pancreatic cancer in his father.    Review of Systems: Review of Systems  Constitutional: Negative.   Respiratory: Negative.   Cardiovascular: Positive for chest pain.  Gastrointestinal: Negative.   Musculoskeletal: Positive for back pain.  Neurological: Negative.   Psychiatric/Behavioral: Negative.   All other systems reviewed and are negative.  PHYSICAL EXAM: VS:  BP 123/74 (BP Location: Right Arm, Patient Position: Sitting, Cuff Size: Large)   Pulse 79   Ht 6\' 1"  (1.854 m)   Wt (!) 304 lb 8 oz (138.1 kg)   BMI 40.17 kg/m  , BMI Body mass index is 40.17 kg/m. GEN: Well nourished, well developed, in no acute distress , obese HEENT: normal  Neck: no JVD, carotid bruits, or masses Cardiac: RRR; no murmurs, rubs, or gallops,no edema  Respiratory:  clear to auscultation bilaterally, normal work of breathing GI: soft, nontender, nondistended, + BS MS: no deformity or atrophy  Discomfort when pushing on his left mid thoracic spine Skin: warm and dry, no rash Neuro:  Strength and sensation are intact Psych: euthymic mood, full affect   Recent Labs: 07/16/2017:  ALT 26; BUN 12; Creatinine, Ser 1.09; Hemoglobin 12.5; Platelets 421; Potassium 4.0; Sodium 138; TSH 2.460    Lipid Panel Lab Results  Component Value Date   CHOL 155 07/16/2017   HDL 36 (L) 07/16/2017   LDLCALC 100 (H) 07/16/2017   TRIG 96 07/16/2017      Wt Readings from Last 3 Encounters:  11/19/17 (!) 304 lb 8 oz (138.1 kg)  09/05/17 284 lb (128.8 kg)  07/06/17 (!) 303 lb (137.4 kg)       ASSESSMENT AND PLAN:  Morbid obesity (HCC) We have encouraged continued exercise,  careful diet management in an effort to lose weight.  Chest pain, unspecified type - Plan: EKG 12-Lead After long discussion, thorough exam, he has tenderness mid thoracic spine worse on the left than the right, radiating pain around that rib distribution to the anterior Likely has chest and back pain exacerbated by coughing that started July 2019, continue to have coughing when he smokes marijuana  Rib pain Recommended thoracic stretching/spine exercises Minimize marijuana to decrease coughing If no improvement would consider chiropractic  Encounter for preventive care Cholesterol low, not smoke cigarettes, no diabetes Recommended weight loss We did discuss other studies such as CT coronary calcium scoring, stress testing No strong indication for these at this time   Disposition:   F/U as needed   Total encounter time more than 45 minutes  Greater than 50% was spent in counseling and coordination of care with the patient    Orders Placed This Encounter  Procedures  . EKG 12-Lead     Signed, Dossie Arbour, M.D., Ph.D. 11/19/2017  [facility] [place], [place] [phone]

## 2017-11-19 ENCOUNTER — Encounter: Payer: Self-pay | Admitting: Cardiovascular Disease

## 2017-11-19 ENCOUNTER — Ambulatory Visit: Payer: BLUE CROSS/BLUE SHIELD | Admitting: Cardiovascular Disease

## 2017-11-19 DIAGNOSIS — E669 Obesity, unspecified: Secondary | ICD-10-CM | POA: Insufficient documentation

## 2017-11-19 DIAGNOSIS — Z0000 Encounter for general adult medical examination without abnormal findings: Secondary | ICD-10-CM | POA: Diagnosis not present

## 2017-11-19 DIAGNOSIS — R079 Chest pain, unspecified: Secondary | ICD-10-CM | POA: Diagnosis not present

## 2017-11-19 DIAGNOSIS — R0781 Pleurodynia: Secondary | ICD-10-CM | POA: Diagnosis not present

## 2017-11-19 NOTE — Patient Instructions (Signed)
Ice, stretch,  Read about thoracic spine stretches Chiro if needed   Medication Instructions:   No medication changes made  Labwork:  No new labs needed  Testing/Procedures:  No further testing at this time   Follow-Up: It was a pleasure seeing you in the office today. Please call us if you have new issues that need to be addressed before your next appt.  [phone]  Your physician wants you to follow-up in:  As needed  If you need a refill on your cardiac medications before your next appointment, please call your pharmacy.  For educational health videos Log in to : [email or web address] Or : [email or web address], password : [email or web address]

## 2017-12-04 ENCOUNTER — Encounter: Payer: Self-pay | Admitting: Physician Assistant

## 2017-12-04 ENCOUNTER — Ambulatory Visit: Payer: BLUE CROSS/BLUE SHIELD | Admitting: Physician Assistant

## 2017-12-04 VITALS — BP 110/90 | HR 73 | Temp 99.3°F | Resp 16 | Ht 73.0 in | Wt 299.0 lb

## 2017-12-04 DIAGNOSIS — Z23 Encounter for immunization: Secondary | ICD-10-CM | POA: Diagnosis not present

## 2017-12-04 DIAGNOSIS — R61 Generalized hyperhidrosis: Secondary | ICD-10-CM

## 2017-12-04 DIAGNOSIS — R1900 Intra-abdominal and pelvic swelling, mass and lump, unspecified site: Secondary | ICD-10-CM | POA: Diagnosis not present

## 2017-12-04 DIAGNOSIS — K921 Melena: Secondary | ICD-10-CM | POA: Diagnosis not present

## 2017-12-04 NOTE — Progress Notes (Signed)
Patient: Brent Waller Male    DOB: 1990/08/29   27 y.o.   MRN: [id number] Visit Date: 12/06/2017  Today's Provider: Trinna Post, PA-C   Chief Complaint  Patient presents with  . Abdominal Pain   Subjective:    HPI Patient here today with multiple complaints.  Abdominal Pain/Abdominal Mass: Reports migrating abdominal pain that happens any time. It could happen in his stomach region or in his LUQ. He has it every day and it could happen for ten minutes or less. There is no relation to eating. He reports nausea, constipation, and diarrhea, but denies vomiting. He report he is taking ibuprofen for this. He also reports a bulge in his LUQ that he is very concerned about. He is concerned this could be a hernia, especially a hiatal hernia. He is also concerned it could be worse.  Bloody stools: He reports bright red blood in his stool. He reports alternating diarrhea and constipation. He reports sometimes painful bowel movements and also anal itching. He first reported these symptoms to me on 07/06/2017. He was given a take home stool card which he did not complete or return to the office because he lost it. He repeatedly denies rectal examination today, saying he is too embarrassed. He denies weight loss and fevers. Denies any mucous in stool.   Wt Readings from Last 3 Encounters:  12/04/17 299 lb (135.6 kg)  11/19/17 (!) 304 lb 8 oz (138.1 kg)  09/05/17 284 lb (128.8 kg)   Night Sweats:   Reports he has night sweats 5/7 nights per week. He says he sleeps with one sheet, sometimes a comforter and his room is between 42 and 72 F. He reports he is a sweaty person at baseline, though this is usually brought on by physical exertion. He has not measured his temperature during these episodes. He has not traveled out of the country or anywhere with malaria or TB, has never been incarcerated. No weight loss, no history of cancer.      No Known Allergies   Current Outpatient  Medications:  .  albuterol (PROVENTIL HFA;VENTOLIN HFA) 108 (90 Base) MCG/ACT inhaler, Inhale 2 puffs into the lungs every 6 (six) hours as needed for wheezing or shortness of breath., Disp: 1 Inhaler, Rfl: 2  Review of Systems  Constitutional: Negative.   Respiratory: Positive for cough.   Gastrointestinal: Positive for abdominal pain, blood in stool and constipation.    Social History   Tobacco Use  . Smoking status: Former Smoker    Packs/day: 0.50    Types: Cigarettes    Last attempt to quit: 02/14/2015    Years since quitting: 2.8  . Smokeless tobacco: Never Used  Substance Use Topics  . Alcohol use: Yes    Comment: occassional   Objective:   BP 110/90 (BP Location: Left Arm, Patient Position: Sitting, Cuff Size: Large)   Pulse 73   Temp 99.3 F (37.4 C) (Oral)   Resp 16   Ht _0  (1.854 m)   Wt 299 lb (135.6 kg)   SpO2 97%   BMI 39.45 kg/m  Vitals:   12/04/17 1611  BP: 110/90  Pulse: 73  Resp: 16  Temp: 99.3 F (37.4 C)  TempSrc: Oral  SpO2: 97%  Weight: 299 lb (135.6 kg)  Height: _1  (1.854 m)     Physical Exam  Constitutional: He is oriented to person, place, and time. He appears well-developed and well-nourished.  Abdominal: Soft.  Normal appearance and bowel sounds are normal. There is no hepatosplenomegaly. There is tenderness in the left upper quadrant.  Abdomen is obese. I do not see distinct mass in right upper quadrant that patient is reporting. LUQ is tender to palpation, patient describes "like a bruise" during my examination.   Genitourinary:  Genitourinary Comments: Repeatedly declines rectal exam.   Neurological: He is alert and oriented to person, place, and time.  Skin: Skin is warm and dry.  Psychiatric: He has a normal mood and affect. His behavior is normal.        Assessment & Plan:     1. Night sweats  Has not measured temperature, unsure if this represents fever. Will check labs as below.  - CBC With Differential -  Ambulatory referral to General Surgery  2. Bloody stool  Patient repeatedly declines rectal exam, saying he is too embarrassed. I stressed the importance of conducting this exam to assess for hemorrhoids, rectal fissures or tears but he continues to decline. He also did not complete prior stool card. Have sent one home with him today to return to clinic. His symptoms could represent initial presentation of Crohns or UC. Will check labs as below, get stool card, and proceed from there. May need GI referral in future but I think we can do initial testing.   - C-reactive protein - Sed Rate (ESR)  3. Abdominal mass, unspecified abdominal location   I do not clearly see abdominal mass in the LUQ as patient describes. Assured that it would be highly unlikely that a hiatal hernia would present in this way, but patient is not reassured. He is worried this is something else causing this. Very anxious about getting to the bottom of this. He thinks he needs to go to GI, which he may at some point to assess rectal bleeding, but he also thinks they will address this "abdominal mass," he does seem most anxious about this. Will refer to surgery.  Return if symptoms worsen or fail to improve.  The entirety of the information documented in the History of Present Illness, Review of Systems and Physical Exam were personally obtained by me. Portions of this information were initially documented by Lynford Humphrey, CMA and reviewed by me for thoroughness and accuracy.   I have spent 25 minutes with this patient, >50% of which was spent on counseling and coordination of care.        Trinna Post, PA-C  [facility]

## 2017-12-06 NOTE — Patient Instructions (Signed)
Rectal Bleeding °Rectal bleeding is when blood comes out of the opening of the butt (anus). People with this kind of bleeding may notice bright red blood in their underwear or in the toilet after they poop (have a bowel movement). They may also have dark red or black poop (stool). Rectal bleeding is often a sign that something is wrong. It needs to be checked by a doctor. °Follow these instructions at home: °Watch for any changes in your condition. Take these actions to help with bleeding and discomfort: °· Eat a diet that is high in fiber. This will keep your poop soft so it is easier for you to poop without pushing too hard. Ask your doctor to tell you what foods and drinks are high in fiber. °· Drink enough fluid to keep your pee (urine) clear or pale yellow. This also helps keep your poop soft. °· Try taking a warm bath. This may help with pain. °· Keep all follow-up visits as told by your doctor. This is important. ° °Get help right away if: °· You have new bleeding. °· You have more bleeding than before. °· You have black or dark red poop. °· You throw up (vomit) blood or something that looks like coffee grounds. °· You have pain or tenderness in your belly (abdomen). °· You have a fever. °· You feel weak. °· You feel sick to your stomach (nauseous). °· You pass out (faint). °· You have very bad pain in your butt. °· You cannot poop. °This information is not intended to replace advice given to you by your health care provider. Make sure you discuss any questions you have with your health care provider. °Document Released: 10/12/2010 Document Revised: 07/08/2015 Document Reviewed: 03/28/2015 °Elsevier Interactive Patient Education © 2018 [employer] ° °

## 2017-12-06 NOTE — Addendum Note (Signed)
Addended by: Trey Sailors on: 12/06/2017 03:29 PM   Modules accepted: Orders

## 2017-12-25 ENCOUNTER — Encounter: Payer: Self-pay | Admitting: Surgery

## 2017-12-25 ENCOUNTER — Other Ambulatory Visit: Payer: Self-pay

## 2017-12-25 ENCOUNTER — Ambulatory Visit (INDEPENDENT_AMBULATORY_CARE_PROVIDER_SITE_OTHER): Payer: BLUE CROSS/BLUE SHIELD | Admitting: Surgery

## 2017-12-25 VITALS — BP 147/98 | HR 70 | Temp 97.7°F | Resp 18 | Ht 73.0 in | Wt 302.0 lb

## 2017-12-25 DIAGNOSIS — R0781 Pleurodynia: Secondary | ICD-10-CM | POA: Diagnosis not present

## 2017-12-25 NOTE — Patient Instructions (Signed)
Patient is to return to the office as needed. He has been informed to call the office with any questions or concerns.  

## 2017-12-27 NOTE — Progress Notes (Signed)
Surgical Clinic History and Physical  Referring provider:  Trey Sailors, PA-C [address] [place], [place] [address]  HISTORY OF PRESENT ILLNESS (HPI):  27 y.o. male presents for evaluation of a LUQ abdominal wall "bulge" or mass. Patient reports he's uncertain how long it's been present, but he recently noticed a prominent focus of assymmetry between his LUQ and his RUQ, expressing concern that this could be a hiatal hernia. He denies any associated pain, redness, focal trauma, constipation, straining with urination, frequent coughing, N/V, fever/chills, CP, or SOB.  PAST MEDICAL HISTORY (PMH):  History reviewed. No pertinent past medical history.   PAST SURGICAL HISTORY (PSH):  Past Surgical History:  Procedure Laterality Date  . CHOLECYSTECTOMY    . LAPAROSCOPIC CHOLECYSTECTOMY       MEDICATIONS:  Prior to Admission medications   Not on File     ALLERGIES:  No Known Allergies   SOCIAL HISTORY:  Social History   Socioeconomic History  . Marital status: Single    Spouse name: Not on file  . Number of children: Not on file  . Years of education: Not on file  . Highest education level: Not on file  Occupational History  . Not on file  Social Needs  . Financial resource strain: Not on file  . Food insecurity:    Worry: Not on file    Inability: Not on file  . Transportation needs:    Medical: Not on file    Non-medical: Not on file  Tobacco Use  . Smoking status: Former Smoker    Packs/day: 0.50    Types: Cigarettes    Last attempt to quit: 02/14/2015    Years since quitting: 2.8  . Smokeless tobacco: Never Used  Substance and Sexual Activity  . Alcohol use: Yes    Comment: occassional  . Drug use: Yes    Types: Marijuana  . Sexual activity: Yes  Lifestyle  . Physical activity:    Days per week: Not on file    Minutes per session: Not on file  . Stress: Not on file  Relationships  . Social connections:    Talks on phone: Not on file     Gets together: Not on file    Attends religious service: Not on file    Active member of club or organization: Not on file    Attends meetings of clubs or organizations: Not on file    Relationship status: Not on file  . Intimate partner violence:    Fear of current or ex partner: Not on file    Emotionally abused: Not on file    Physically abused: Not on file    Forced sexual activity: Not on file  Other Topics Concern  . Not on file  Social History Narrative  . Not on file    The patient currently resides (home / rehab facility / nursing home): Home The patient normally is (ambulatory / bedbound): Ambulatory  FAMILY HISTORY:  Family History  Problem Relation Age of Onset  . Pancreatic cancer Father   . Heart Problems Father   . Lung cancer Maternal Grandmother   . Cancer Maternal Grandfather     Otherwise negative/non-contributory.  REVIEW OF SYSTEMS:  Constitutional: denies any other weight loss, fever, chills, or sweats  Eyes: denies any other vision changes, history of eye injury  ENT: denies sore throat, hearing problems  Respiratory: denies shortness of breath, wheezing  Cardiovascular: denies chest pain, palpitations  Gastrointestinal: denies abdominal pain,  N/V, constipation, or diarrhea Musculoskeletal: denies any other joint pains or cramps  Skin: Denies any other rashes or skin discolorations except as per HPI Neurological: denies any other headache, dizziness, weakness  Psychiatric: Denies any other depression, anxiety   All other review of systems were otherwise negative   VITAL SIGNS:  BP (!) 147/98   Pulse 70   Temp 97.7 F (36.5 C) (Temporal)   Resp 18   Ht 6\' 1"  (1.854 m)   Wt (!) 302 lb (137 kg)   SpO2 98%   BMI 39.84 kg/m   PHYSICAL EXAM:  Constitutional:  -- Obese body habitus  -- Awake, alert, and oriented x3  Eyes:  -- Pupils equally round and reactive to light  -- No scleral icterus  Ear, nose, throat:  -- No jugular venous  distension -- No nasal drainage, bleeding Pulmonary:  -- No crackles  -- Equal breath sounds bilaterally -- Breathing non-labored at rest Cardiovascular:  -- S1, S2 present  -- No pericardial rubs  Gastrointestinal:  -- Abdomen soft, nontender, non-distended, no guarding/rebound  -- Site of expressed concern appears to be associated with slightly more prominent Left > Right costal margin along patient's ribs -- No abdominal masses appreciated, pulsatile or otherwise Musculoskeletal and Integumentary:  -- Wounds or skin discoloration: None appreciated -- Extremities: B/L UE and LE FROM, hands and feet warm, no edema  Neurologic:  -- Motor function: Intact and symmetric -- Sensation: Intact and symmetric  Labs:  CBC Latest Ref Rng & Units 07/16/2017 04/15/2010 11/01/2009  WBC 3.4 - 10.8 x10E3/uL 11.6(H) - 9.7  Hemoglobin 13.0 - 17.7 g/dL 12.5(L) 16.3 13.4  Hematocrit 37.5 - 51.0 % 37.5 48.0 39.8  Platelets 150 - 450 x10E3/uL 421 - 208   CMP Latest Ref Rng & Units 07/16/2017 04/15/2010 11/01/2009  Glucose 65 - 99 mg/dL 86 161(W100(H) 960(A114(H)  BUN 6 - 20 mg/dL 12 16 7   Creatinine 0.76 - 1.27 mg/dL 5.401.09 0.8 9.810.94  Sodium 191134 - 144 mmol/L 138 139 141  Potassium 3.5 - 5.2 mmol/L 4.0 4.3 3.6  Chloride 96 - 106 mmol/L 103 101 106  CO2 20 - 29 mmol/L 20 - 31  Calcium 8.7 - 10.2 mg/dL 9.2 - 8.7  Total Protein 6.0 - 8.5 g/dL 7.6 - 6.1  Total Bilirubin 0.0 - 1.2 mg/dL 0.7 - 2.8(H)  Alkaline Phos 39 - 117 IU/L 73 - 47  AST 0 - 40 IU/L 31 - 31  ALT 0 - 44 IU/L 26 - 31   Imaging studies:  Chest X-ray (09/05/2017) - personally reviewed The heart size and mediastinal contours are within normal limits. Both lungs are clear. The visualized skeletal structures are unremarkable. Remote cholecystectomy noted. Trachea is midline. No acute osseous finding.  Assessment/Plan:  27 y.o. male with essentially asymptomatic (beyond visible) slightly more prominent Left > Right costal margin, complicated by  co-morbidities including morbid obesity (BMI 40).   - apparent etiology for asymmetry discussed  - no indication for surgical intervention at this time  - signs/symptoms of hiatal and ventral hernias were discussed  - return to clinic as needed, instructed to call if any questions or concerns  - weight loss encouraged and discussed  All of the above recommendations were discussed with the patient, and all of patient's questions were answered to his expressed satisfaction.  Thank you for the opportunity to participate in this patient's care.  -- Scherrie GerlachJason E. Earlene Plateravis, MD, RPVI [place]:  [facility]  for exceptional care. Office: [phone]

## 2018-01-03 ENCOUNTER — Encounter: Payer: Self-pay | Admitting: Surgery

## 2018-06-18 ENCOUNTER — Encounter: Payer: Self-pay | Admitting: Physician Assistant

## 2018-06-18 ENCOUNTER — Ambulatory Visit (INDEPENDENT_AMBULATORY_CARE_PROVIDER_SITE_OTHER): Payer: BLUE CROSS/BLUE SHIELD | Admitting: Physician Assistant

## 2018-06-18 DIAGNOSIS — T148XXA Other injury of unspecified body region, initial encounter: Secondary | ICD-10-CM | POA: Diagnosis not present

## 2018-06-18 MED ORDER — CYCLOBENZAPRINE HCL 10 MG PO TABS: 10.0000 mg | ORAL_TABLET | Freq: Every day | ORAL | 0 refills | Status: DC

## 2018-06-18 MED ORDER — PREDNISONE 10 MG (21) PO TBPK: ORAL_TABLET | ORAL | 0 refills | Status: DC

## 2018-06-18 NOTE — Progress Notes (Signed)
       Patient: Brent Waller Male    DOB: 01-15-1991   28 y.o.   MRN: [id number] Visit Date: 06/18/2018  Today's Provider: Trey Sailors, PA-C   Chief Complaint  Patient presents with  . Muscle Pain   Subjective:    Virtual Visit via Video Note  I connected with Brent Waller on 06/18/18 at 11:00 AM EDT by a video enabled telemedicine application and verified that I am speaking with the correct person using two identifiers.   I discussed the limitations of evaluation and management by telemedicine and the availability of in person appointments. The patient expressed understanding and agreed to proceed.   Patient location: home Provider location: [facility]/home office  Persons involved in the visit: patient, provider   Interactive audio and video communications were attempted, although failed due to patient's inability to connect to video. Continued visit with audio only interaction with patient agreement.  HPI   Patient presents today with left sharp pain in chest and back that is associated with movement. He reports he is a chronic smoker and has been coughing more. He reports he also has neck pain that is associated with movement. Reports pain starts in the back in the morning and spreads to chest in the day. Reports he smokes 2-3 cigarettes daily. Reports moving heavy furniture. Reports he can point to a single area where it hurts with a finger. Denies fever, chills, SOB, nausea, vomiting, diaphoresis. Reports he has tried ibuprofen 600-800 mg twice daily and ointment.   No Known Allergies  No current outpatient medications on file.  Review of Systems  Constitutional: Negative for appetite change, chills and fever.  Respiratory: Negative for chest tightness, shortness of breath and wheezing.   Cardiovascular: Negative for chest pain and palpitations.  Gastrointestinal: Negative for abdominal pain, nausea and vomiting.    Social History   Tobacco  Use  . Smoking status: Former Smoker    Packs/day: 0.50    Types: Cigarettes    Last attempt to quit: 02/14/2015    Years since quitting: 3.3  . Smokeless tobacco: Never Used  Substance Use Topics  . Alcohol use: Yes    Comment: occassional      Objective:   There were no vitals taken for this visit. There were no vitals filed for this visit.   Physical Exam      Assessment & Plan    1. Muscle strain  - predniSONE (STERAPRED UNI-PAK 21 TAB) 10 MG (21) TBPK tablet; Take 6 pills on day 1, take 5 pills on day 2, take 4 pills on day 3, and so on until complete.  Dispense: 21 tablet; Refill: 0 - cyclobenzaprine (FLEXERIL) 10 MG tablet; Take 1 tablet (10 mg total) by mouth at bedtime.  Dispense: 30 tablet; Refill: 0  The entirety of the information documented in the History of Present Illness, Review of Systems and Physical Exam were personally obtained by me. Portions of this information were initially documented by April M. Hyacinth Meeker, CMA and reviewed by me for thoroughness and accuracy.   F/u prn.     Trey Sailors, PA-C  [facility]

## 2018-06-18 NOTE — Patient Instructions (Signed)
Muscle Strain A muscle strain is an injury that happens when a muscle is stretched longer than normal. This can happen during a fall, sports, or lifting. This can tear some muscle fibers. Usually, recovery from muscle strain takes 1-2 weeks. Complete healing normally takes 5-6 weeks. This condition is first treated with PRICE therapy. This involves:  Protecting your muscle from being injured again.  Resting your injured muscle.  Icing your injured muscle.  Applying pressure (compression) to your injured muscle. This may be done with a splint or elastic bandage.  Raising (elevating) your injured muscle. Your doctor may also recommend medicine for pain. Follow these instructions at home: If you have a splint:  Wear the splint as told by your doctor. Take it off only as told by your doctor.  Loosen the splint if your fingers or toes tingle, get numb, or turn cold and blue.  Keep the splint clean.  If the splint is not waterproof: ? Do not let it get wet. ? Cover it with a watertight covering when you take a bath or a shower. Managing pain, stiffness, and swelling   If directed, put ice on your injured area. ? If you have a removable splint, take it off as told by your doctor. ? Put ice in a plastic bag. ? Place a towel between your skin and the bag. ? Leave the ice on for 20 minutes, 2-3 times a day.  Move your fingers or toes often. This helps to avoid stiffness and lessen swelling.  Raise your injured area above the level of your heart while you are sitting or lying down.  Wear an elastic bandage as told by your doctor. Make sure it is not too tight. General instructions  Take over-the-counter and prescription medicines only as told by your doctor.  Limit your activity. Rest your injured muscle as told by your doctor. Your doctor may say that gentle movements are okay.  If physical therapy was prescribed, do exercises as told by your doctor.  Do not put pressure on any  part of the splint until it is fully hardened. This may take many hours.  Do not use any products that contain nicotine or tobacco, such as cigarettes and e-cigarettes. These can delay bone healing. If you need help quitting, ask your doctor.  Warm up before you exercise. This helps to prevent more muscle strains.  Ask your doctor when it is safe to drive if you have a splint.  Keep all follow-up visits as told by your doctor. This is important. Contact a doctor if:  You have more pain or swelling in your injured area. Get help right away if:  You have any of these problems in your injured area: ? You have numbness. ? You have tingling. ? You lose a lot of strength. Summary  A muscle strain is an injury that happens when a muscle is stretched longer than normal.  This condition is first treated with PRICE therapy. This includes protecting, resting, icing, adding pressure, and raising your injury.  Limit your activity. Rest your injured muscle as told by your doctor. Your doctor may say that gentle movements are okay.  Warm up before you exercise. This helps to prevent more muscle strains. This information is not intended to replace advice given to you by your health care provider. Make sure you discuss any questions you have with your health care provider. Document Released: 11/09/2007 Document Revised: 03/08/2016 Document Reviewed: 03/08/2016 Elsevier Interactive Patient Education  2019 Elsevier   Inc.  

## 2018-07-20 ENCOUNTER — Other Ambulatory Visit: Payer: Self-pay | Admitting: Physician Assistant

## 2018-07-20 DIAGNOSIS — T148XXA Other injury of unspecified body region, initial encounter: Secondary | ICD-10-CM

## 2018-08-19 ENCOUNTER — Other Ambulatory Visit: Payer: Self-pay | Admitting: Physician Assistant

## 2018-08-19 DIAGNOSIS — T148XXA Other injury of unspecified body region, initial encounter: Secondary | ICD-10-CM

## 2018-08-21 NOTE — Telephone Encounter (Signed)
Please advise refill? 

## 2018-08-22 NOTE — Telephone Encounter (Signed)
I have declined refill. He hasn't been seen in months. It may be a pharmacy request but if it is him, he will need to be re-evaluated.

## 2019-05-09 IMAGING — CR DG CHEST 2V
1 series · 2 of 2 positions shown · non-contrast
Comparison: 04/15/2010

CLINICAL DATA: Flu symptoms, cough and chest pain

EXAM:
CHEST - 2 VIEW

[Series 1: dg chest 2 view · 0.14mm/px · 2 of 2 slices shown]
[im 1/2]
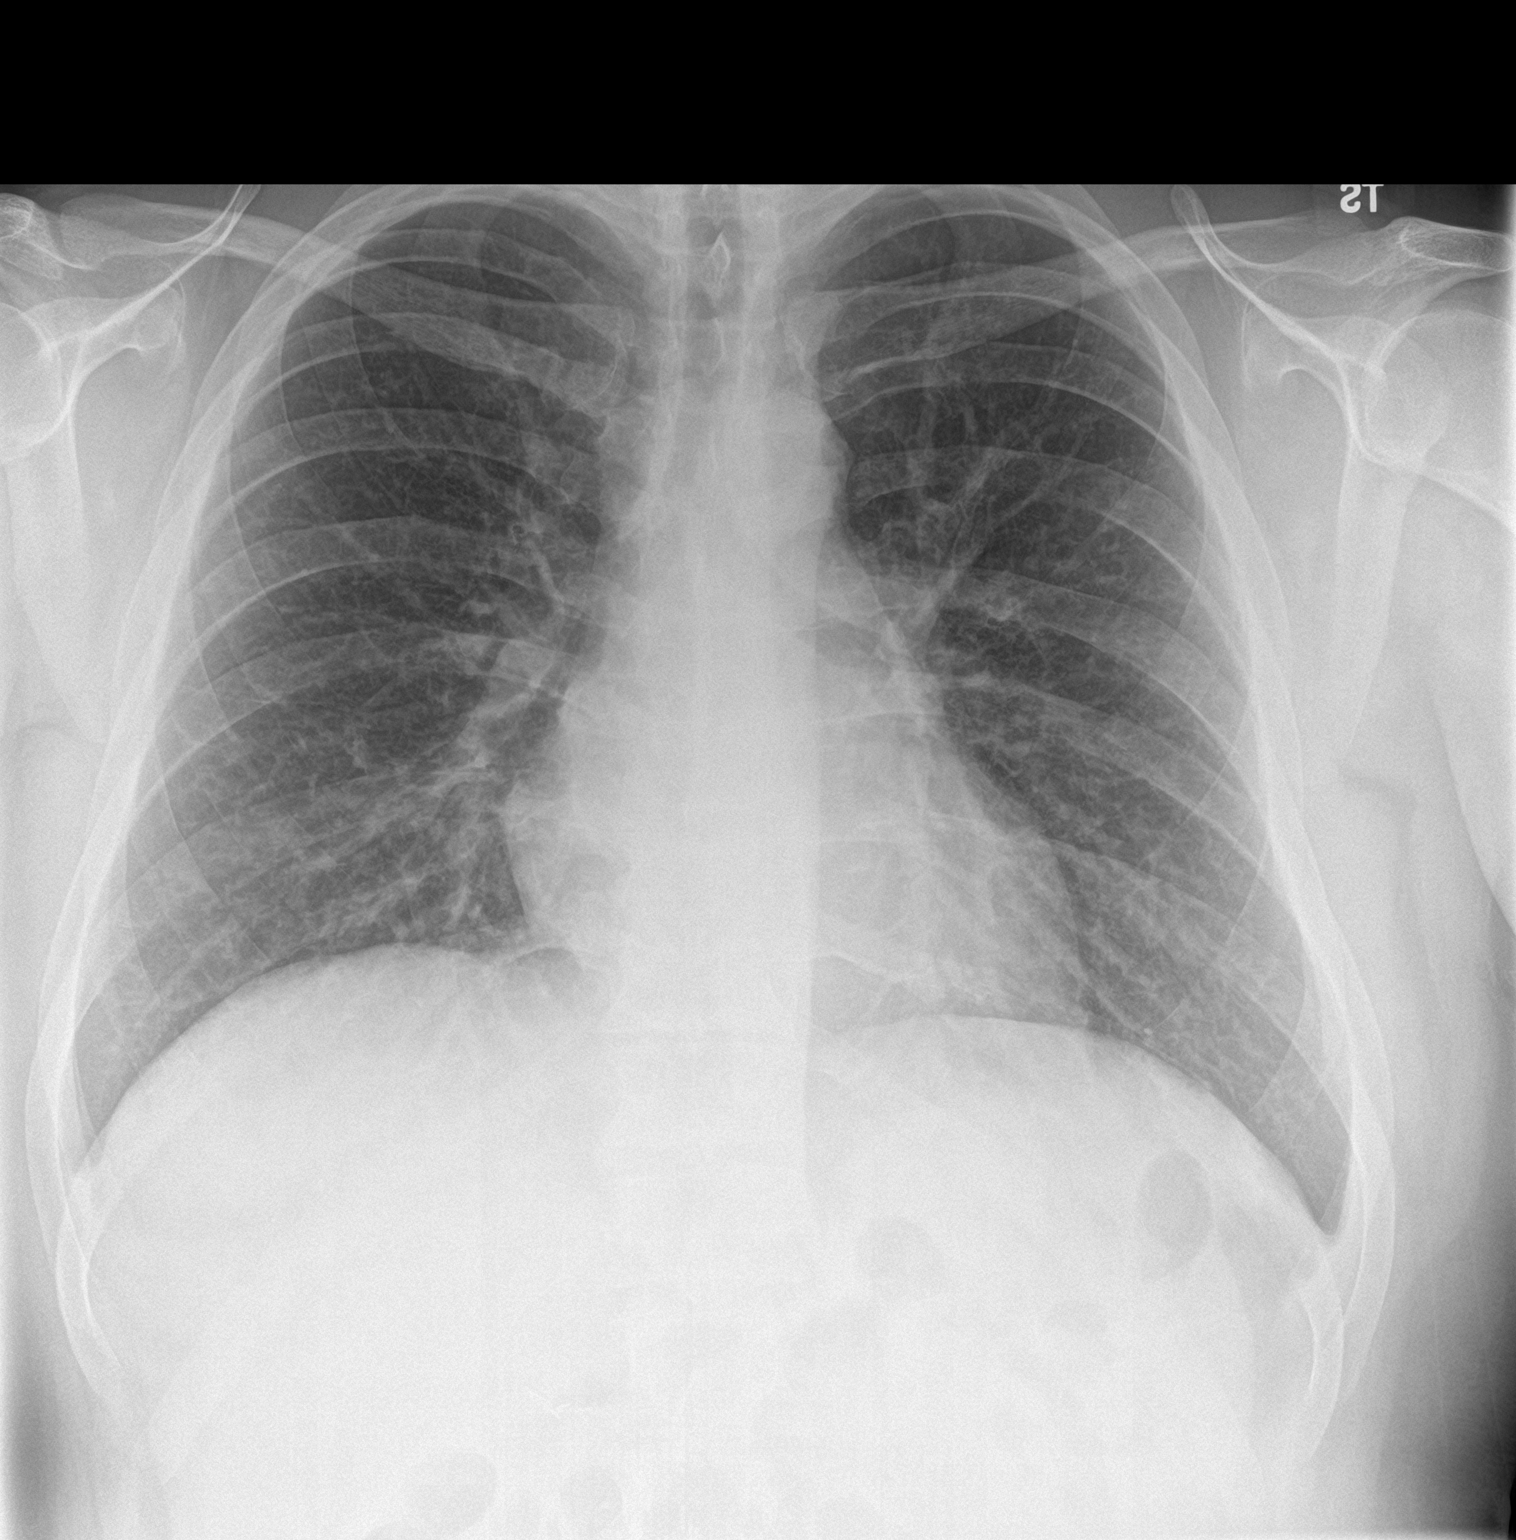
[im 2/2]
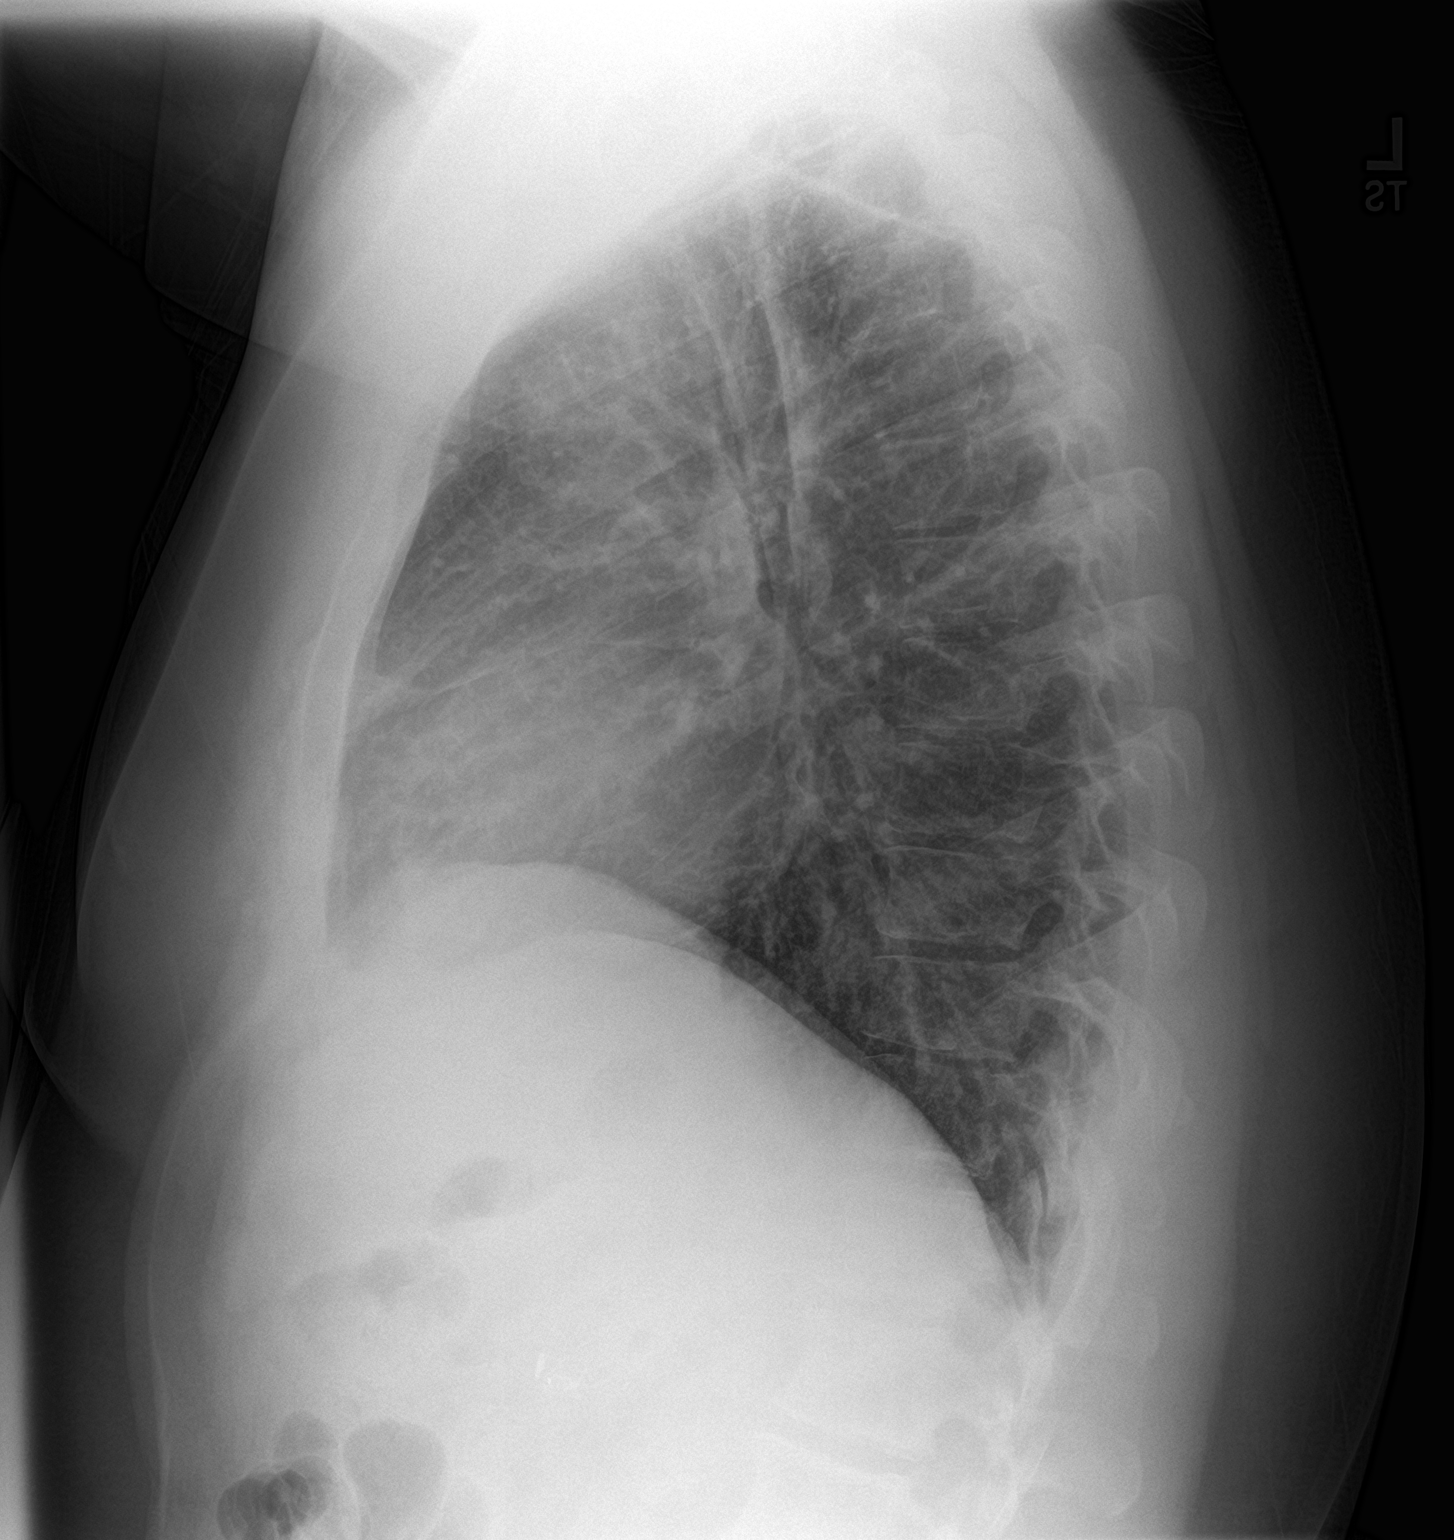

[2 of 2 positions shown; findings below may reference images not displayed]

FINDINGS: The heart size and mediastinal contours are within normal limits.
Both lungs are clear. The visualized skeletal structures are
unremarkable. Remote cholecystectomy noted. Trachea is midline. No
acute osseous finding.
IMPRESSION: No active cardiopulmonary disease.

## 2021-01-26 NOTE — Progress Notes (Signed)
Established patient visit   Patient: Brent Waller   DOB: 10/24/1990   30 y.o. Male  MRN: [id number] Visit Date: 01/27/2021  Today's healthcare provider: Shirlee Latch, MD   Chief Complaint  Patient presents with   Cyst    Under folds of skin that rubs together under arms thighs and stomach folds come and go over the past 5 years within the last 1-2 years in the stomach within the past 6 months has been worse     Subjective    HPI HPI     Cyst    Additional comments: Under folds of skin that rubs together under arms thighs and stomach folds come and go over the past 5 years within the last 1-2 years in the stomach within the past 6 months has been worse       Last edited by Brent Waller on 01/27/2021  1:14 PM.      Brent Waller 5 years ago and has come intermittently - groin, axillae, under pannus. More common in last 6 months. Never gotten it looked at before.  Mom has the same problem.   Medications: Outpatient Medications Prior to Visit  Medication Sig   [DISCONTINUED] cyclobenzaprine (FLEXERIL) 10 MG tablet TAKE 1 TABLET BY MOUTH EVERYDAY AT BEDTIME (Patient not taking: Reported on 01/27/2021)   [DISCONTINUED] predniSONE (STERAPRED UNI-PAK 21 TAB) 10 MG (21) TBPK tablet Take 6 pills on day 1, take 5 pills on day 2, take 4 pills on day 3, and so on until complete. (Patient not taking: Reported on 01/27/2021)   No facility-administered medications prior to visit.    Review of Systems - per HPI     Objective    BP 129/76 (BP Location: Right Arm, Patient Position: Sitting, Cuff Size: Normal)    Pulse 85    Temp 98.2 F (36.8 C) (Temporal)    Wt (!) 301 lb 1.6 oz (136.6 kg)    SpO2 98%    BMI 39.73 kg/m  {Show previous vital signs (optional):23777}  Physical Exam Vitals reviewed.  Constitutional:      General: He is not in acute distress.    Appearance: Normal appearance. He is not diaphoretic.  HENT:     Head: Normocephalic and atraumatic.  Eyes:      General: No scleral icterus.    Conjunctiva/sclera: Conjunctivae normal.  Cardiovascular:     Rate and Rhythm: Normal rate and regular rhythm.  Pulmonary:     Effort: Pulmonary effort is normal. No respiratory distress.  Musculoskeletal:     Cervical back: Neck supple.     Right lower leg: No edema.     Left lower leg: No edema.  Lymphadenopathy:     Cervical: No cervical adenopathy.  Skin:    General: Skin is warm and dry.     Comments: Erythematous rash under pannus with one area of induration. Old scarring from previous abscessed in b/l axillae.  Neurological:     Mental Status: He is alert and oriented to person, place, and time. Mental status is at baseline.  Psychiatric:        Mood and Affect: Mood normal.        Behavior: Behavior normal.      No results found for any visits on 01/27/21.  Assessment & Plan     Problem List Items Addressed This Visit       Musculoskeletal and Integument   Hidradenitis suppurativa - Primary    Longstanding but new diagnosis  Does have one abscess that looks infected today so will treat with doxycyline Picture is c/w hidradenitis Will send to Derm for ongoing management and possible suppressive therapy      Relevant Orders   Ambulatory referral to Dermatology     Other   Obesity    Discussed importance of healthy weight management Discussed diet and exercise       Mixed hyperlipidemia    Reviewed last lipid panel Not currently on a statin Recheck FLP and CMP Discussed diet and exercise       Relevant Orders   Lipid panel   Comprehensive metabolic panel     Return in about 6 months (around 07/28/2021) for CPE.      I, Brent Latch, MD, have reviewed all documentation for this visit. The documentation on 01/27/21 for the exam, diagnosis, procedures, and orders are all accurate and complete.   Brent Waller, Brent Schlein, MD, MPH [facility]

## 2021-01-27 ENCOUNTER — Encounter: Payer: Self-pay | Admitting: Family Medicine

## 2021-01-27 ENCOUNTER — Ambulatory Visit (INDEPENDENT_AMBULATORY_CARE_PROVIDER_SITE_OTHER): Payer: BC Managed Care – PPO | Admitting: Family Medicine

## 2021-01-27 ENCOUNTER — Other Ambulatory Visit: Payer: Self-pay

## 2021-01-27 VITALS — BP 129/76 | HR 85 | Temp 98.2°F | Wt 301.1 lb

## 2021-01-27 DIAGNOSIS — E782 Mixed hyperlipidemia: Secondary | ICD-10-CM | POA: Diagnosis not present

## 2021-01-27 DIAGNOSIS — E669 Obesity, unspecified: Secondary | ICD-10-CM | POA: Diagnosis not present

## 2021-01-27 DIAGNOSIS — Z6839 Body mass index (BMI) 39.0-39.9, adult: Secondary | ICD-10-CM | POA: Diagnosis not present

## 2021-01-27 DIAGNOSIS — L732 Hidradenitis suppurativa: Secondary | ICD-10-CM | POA: Insufficient documentation

## 2021-01-27 MED ORDER — DOXYCYCLINE HYCLATE 100 MG PO TABS: 100.0000 mg | ORAL_TABLET | Freq: Two times a day (BID) | ORAL | 0 refills | Status: AC

## 2021-01-27 NOTE — Assessment & Plan Note (Signed)
Longstanding but new diagnosis Does have one abscess that looks infected today so will treat with doxycyline Picture is c/w hidradenitis Will send to Derm for ongoing management and possible suppressive therapy

## 2021-01-27 NOTE — Assessment & Plan Note (Signed)
Reviewed last lipid panel Not currently on a statin Recheck FLP and CMP Discussed diet and exercise  

## 2021-01-27 NOTE — Assessment & Plan Note (Signed)
Discussed importance of healthy weight management Discussed diet and exercise  

## 2021-01-28 LAB — COMPREHENSIVE METABOLIC PANEL
ALT: 16 IU/L (ref 0–44)
AST: 17 IU/L (ref 0–40)
Albumin/Globulin Ratio: 1.4 (ref 1.2–2.2)
Albumin: 4.3 g/dL (ref 4.1–5.2)
Alkaline Phosphatase: 79 IU/L (ref 44–121)
BUN/Creatinine Ratio: 13 (ref 9–20)
BUN: 15 mg/dL (ref 6–20)
Bilirubin Total: 0.3 mg/dL (ref 0.0–1.2)
CO2: 24 mmol/L (ref 20–29)
Calcium: 9.1 mg/dL (ref 8.7–10.2)
Chloride: 102 mmol/L (ref 96–106)
Creatinine, Ser: 1.17 mg/dL (ref 0.76–1.27)
Globulin, Total: 3 g/dL (ref 1.5–4.5)
Glucose: 86 mg/dL (ref 70–99)
Potassium: 5.1 mmol/L (ref 3.5–5.2)
Sodium: 142 mmol/L (ref 134–144)
Total Protein: 7.3 g/dL (ref 6.0–8.5)
eGFR: 87 mL/min/{1.73_m2} (ref >59)

## 2021-01-28 LAB — LIPID PANEL
Chol/HDL Ratio: 4.6 ratio (ref 0.0–5.0)
Cholesterol, Total: 160 mg/dL (ref 100–199)
HDL: 35 mg/dL — ABNORMAL LOW (ref >39)
LDL Chol Calc (NIH): 97 mg/dL (ref 0–99)
Triglycerides: 158 mg/dL — ABNORMAL HIGH (ref 0–149)
VLDL Cholesterol Cal: 28 mg/dL (ref 5–40)

## 2021-07-26 NOTE — Progress Notes (Deleted)
Complete physical exam   Patient: Brent Waller   DOB: 17-Oct-1990   31 y.o. Male  MRN: [id number] Visit Date: 07/29/2021  Today's healthcare provider: Lavon Paganini, MD   No chief complaint on file.  Subjective    Brent Waller is a 31 y.o. male who presents today for a complete physical exam.  He reports consuming a {diet types:17450} diet. {Exercise:19826} He generally feels {well/fairly well/poorly:18703}. He reports sleeping {well/fairly well/poorly:18703}. He {does/does not:200015} have additional problems to discuss today.  HPI  ***  No past medical history on file. Past Surgical History:  Procedure Laterality Date   CHOLECYSTECTOMY     LAPAROSCOPIC CHOLECYSTECTOMY     Social History   Socioeconomic History   Marital status: Single    Spouse name: Not on file   Number of children: Not on file   Years of education: Not on file   Highest education level: Not on file  Occupational History   Not on file  Tobacco Use   Smoking status: Former    Packs/day: 0.50    Types: Cigarettes    Quit date: 02/14/2015    Years since quitting: 6.4   Smokeless tobacco: Never  Vaping Use   Vaping Use: Every day  Substance and Sexual Activity   Alcohol use: Yes    Comment: occassional   Drug use: Yes    Types: Marijuana   Sexual activity: Yes  Other Topics Concern   Not on file  Social History Narrative   Not on file   Social Determinants of Health   Financial Resource Strain: Not on file  Food Insecurity: Not on file  Transportation Needs: Not on file  Physical Activity: Not on file  Stress: Not on file  Social Connections: Not on file  Intimate Partner Violence: Not on file   Family Status  Relation Name Status   Mother  Alive   Father  Alive   MGM  Deceased   MGF  Alive   Family History  Problem Relation Age of Onset   Pancreatic cancer Father    Heart Problems Father    Lung cancer Maternal Grandmother    Cancer Maternal Grandfather    No  Known Allergies  Patient Care Team: Bacigalupo, Dionne Bucy, MD as PCP - General (Family Medicine)   Medications: No outpatient medications prior to visit.   No facility-administered medications prior to visit.    Review of Systems  All other systems reviewed and are negative.   {Labs  Heme  Chem  Endocrine  Serology  Results Review (optional):23779}  Objective     There were no vitals taken for this visit. {Show previous vital signs (optional):23777}   Physical Exam  ***  Last depression screening scores    01/27/2021    1:23 PM 06/18/2018   10:32 AM 07/06/2017    3:01 PM  PHQ 2/9 Scores  PHQ - 2 Score 0 0 0  PHQ- 9 Score 4 3 8    Last fall risk screening    01/27/2021    1:24 PM  [place] in the past year? 0  Number falls in past yr: 0  Injury with Fall? 0  Risk for fall due to : No Fall Risks   Last Audit-C alcohol use screening    01/27/2021    1:24 PM  Alcohol Use Disorder Test (AUDIT)  1. How often do you have a drink containing alcohol? 0   A score of  3 or more in women, and 4 or more in men indicates increased risk for alcohol abuse, EXCEPT if all of the points are from question 1   No results found for any visits on 07/29/21.  Assessment & Plan    Routine Health Maintenance and Physical Exam  Exercise Activities and Dietary recommendations  Goals   None     Immunization History  Administered Date(s) Administered   Influenza,inj,Quad PF,6+ Mos 12/04/2017   Moderna Sars-Covid-2 Vaccination 07/21/2020    Health Maintenance  Topic Date Due   TETANUS/TDAP  Never done   Hepatitis C Screening  Completed   HIV Screening  Completed   HPV VACCINES  Aged Out   INFLUENZA VACCINE  Discontinued   COVID-19 Vaccine  Discontinued    Discussed health benefits of physical activity, and encouraged him to engage in regular exercise appropriate for his age and condition.  ***  No follow-ups on file.     {provider  attestation***:1}   Lavon Paganini, MD  [facility] [phone] (phone) [phone] (fax)  [place]

## 2021-07-29 ENCOUNTER — Encounter: Payer: Self-pay | Admitting: Family Medicine

## 2023-01-10 ENCOUNTER — Ambulatory Visit: Payer: Managed Care, Other (non HMO)

## 2023-01-10 ENCOUNTER — Ambulatory Visit
Admission: EM | Admit: 2023-01-10 | Discharge: 2023-01-10 | Disposition: A | Discharge status: Home / Self Care | Payer: Managed Care, Other (non HMO) | Attending: Internal Medicine | Admitting: Internal Medicine

## 2023-01-10 DIAGNOSIS — J189 Pneumonia, unspecified organism: Secondary | ICD-10-CM

## 2023-01-10 LAB — POCT INFLUENZA A/B
Influenza A, POC: NEGATIVE
Influenza B, POC: NEGATIVE

## 2023-01-10 LAB — POCT RAPID STREP A (OFFICE): Rapid Strep A Screen: NEGATIVE

## 2023-01-10 MED ORDER — AMOXICILLIN-POT CLAVULANATE 875-125 MG PO TABS: 1.0000 | ORAL_TABLET | Freq: Two times a day (BID) | ORAL | 0 refills | Status: AC

## 2023-01-10 MED ORDER — AZITHROMYCIN 250 MG PO TABS: 250.0000 mg | ORAL_TABLET | Freq: Every day | ORAL | 0 refills | Status: AC

## 2023-01-10 NOTE — ED Provider Notes (Signed)
[facility]    CSN: [id number] Arrival date & time: 01/10/23  1150      History   Chief Complaint Chief Complaint  Patient presents with   Cough    HPI Brent Waller is a 32 y.o. male.   Patient here today for evaluation of cough, congestion, fever that started 2 days ago.  He reports Tmax of 100.8.  He has not had any vomiting.  He has been taking over-the-counter medication with mild relief.  He reports he did have amoxicillin leftover at home and took 2 doses of same.  He took COVID test at home that was negative.  The history is provided by the patient.  Cough Associated symptoms: chills, fever, shortness of breath and sore throat   Associated symptoms: no ear pain and no eye discharge     History reviewed. No pertinent past medical history.  Patient Active Problem List   Diagnosis Date Noted   Hidradenitis suppurativa 01/27/2021   Mixed hyperlipidemia 01/27/2021   Obesity 11/19/2017   Chest pain 11/19/2017   Rib pain 11/19/2017    Past Surgical History:  Procedure Laterality Date   CHOLECYSTECTOMY     LAPAROSCOPIC CHOLECYSTECTOMY         Home Medications    Prior to Admission medications   Medication Sig Start Date End Date Taking? Authorizing Provider  amoxicillin-clavulanate (AUGMENTIN) 875-125 MG tablet Take 1 tablet by mouth every 12 (twelve) hours. 01/10/23  Yes Tomi Bamberger, PA-C  azithromycin (ZITHROMAX) 250 MG tablet Take 1 tablet (250 mg total) by mouth daily. Take first 2 tablets together, then 1 every day until finished. 01/10/23  Yes Tomi Bamberger, PA-C    Family History Family History  Problem Relation Age of Onset   Pancreatic cancer Father    Heart Problems Father    Lung cancer Maternal Grandmother    Cancer Maternal Grandfather     Social History Social History   Tobacco Use   Smoking status: Former    Current packs/day: 0.00    Types: Cigarettes    Quit date: 02/14/2015    Years since quitting: 7.9    Smokeless tobacco: Never  Vaping Use   Vaping status: Some Days  Substance Use Topics   Alcohol use: Yes    Comment: occassional   Drug use: Yes    Types: Marijuana     Allergies   Patient has no known allergies.   Review of Systems Review of Systems  Constitutional:  Positive for chills and fever.  HENT:  Positive for congestion and sore throat. Negative for ear pain.   Eyes:  Negative for discharge and redness.  Respiratory:  Positive for cough and shortness of breath.   Gastrointestinal:  Negative for abdominal pain, nausea and vomiting.     Physical Exam Triage Vital Signs ED Triage Vitals [01/10/23 1203]  Encounter Vitals Group     BP 128/88     Systolic BP Percentile      Diastolic BP Percentile      Pulse Rate 89     Resp 18     Temp 99.1 F (37.3 C)     Temp Source Oral     SpO2 95 %     Weight      Height      Head Circumference      Peak Flow      Pain Score 8     Pain Loc      Pain Education  Exclude from Growth Chart    No data found.  Updated Vital Signs BP 128/88 (BP Location: Left Arm)   Pulse 89   Temp 99.1 F (37.3 C) (Oral)   Resp 18   SpO2 95%   Visual Acuity Right Eye Distance:   Left Eye Distance:   Bilateral Distance:    Right Eye Near:   Left Eye Near:    Bilateral Near:     Physical Exam Vitals and nursing note reviewed.  Constitutional:      General: He is not in acute distress.    Appearance: Normal appearance. He is not ill-appearing.  HENT:     Head: Normocephalic and atraumatic.     Nose: Congestion present.     Mouth/Throat:     Mouth: Mucous membranes are moist.     Pharynx: Oropharynx is clear. No oropharyngeal exudate or posterior oropharyngeal erythema.  Eyes:     Conjunctiva/sclera: Conjunctivae normal.  Cardiovascular:     Rate and Rhythm: Normal rate and regular rhythm.     Heart sounds: Normal heart sounds. No murmur heard. Pulmonary:     Effort: Pulmonary effort is normal. No respiratory  distress.     Breath sounds: Wheezing and rhonchi present. No rales.     Comments: Scattered wheezes and rhonchi to left lung fields Skin:    General: Skin is warm and dry.  Neurological:     Mental Status: He is alert.  Psychiatric:        Mood and Affect: Mood normal.        Thought Content: Thought content normal.      UC Treatments / Results  Labs (all labs ordered are listed, but only abnormal results are displayed) Labs Reviewed  POCT INFLUENZA A/B - Normal  POCT RAPID STREP A (OFFICE) - Normal    EKG   Radiology DG Chest 2 View  Result Date: 01/10/2023 CLINICAL DATA:  Cough and fever EXAM: CHEST - 2 VIEW COMPARISON:  Chest radiograph 09/05/2017 FINDINGS: Stable cardiac and mediastinal contours. Left mid lower lung consolidation. No pleural effusion or pneumothorax. Thoracic spine degenerative changes. IMPRESSION: Left mid lower lung consolidation concerning for pneumonia. Followup PA and lateral chest X-ray is recommended in 3-4 weeks following trial of antibiotic therapy to ensure resolution and exclude underlying malignancy. Electronically Signed   By: Annia Belt M.D.   On: 01/10/2023 12:50    Procedures Procedures (including critical care time)  Medications Ordered in UC Medications - No data to display  Initial Impression / Assessment and Plan / UC Course  I have reviewed the triage vital signs and the nursing notes.  Pertinent labs & imaging results that were available during my care of the patient were reviewed by me and considered in my medical decision making (see chart for details).     X-ray with findings concerning for pneumonia.  Will treat with Augmentin and Z-Pak.  Recommended follow-up if no gradual improvement or with any worsening otherwise encouraged patient to return in 3 to 4 weeks for repeat x-ray as recommended by radiology.   Final Clinical Impressions(s) / UC Diagnoses   Final diagnoses:  Pneumonia of left lower lobe due to infectious  organism   Discharge Instructions   None    ED Prescriptions     Medication Sig Dispense Auth. Provider   amoxicillin-clavulanate (AUGMENTIN) 875-125 MG tablet Take 1 tablet by mouth every 12 (twelve) hours. 14 tablet Erma Pinto F, PA-C   azithromycin (ZITHROMAX) 250 MG tablet Take  1 tablet (250 mg total) by mouth daily. Take first 2 tablets together, then 1 every day until finished. 6 tablet Tomi Bamberger, PA-C      PDMP not reviewed this encounter.   Tomi Bamberger, PA-C 01/10/23 1321

## 2023-01-10 NOTE — ED Triage Notes (Signed)
Fever 101, cough, cough, SOB with exertion, fatigue, sweating, chills that started 2 days ago. Taking amoxicillin 2 pills that were left over at home, ibuprofen and tylenol with no relief of symptoms.   Took a home Covid test today that was negative.
